# Patient Record
Sex: Male | Born: 1940
Health system: Southern US, Community
[De-identification: ages and names within clinical notes are randomized; demographics above are authoritative.]

## PROBLEM LIST (undated history)

## (undated) DIAGNOSIS — K449 Diaphragmatic hernia without obstruction or gangrene: Secondary | ICD-10-CM

## (undated) DIAGNOSIS — C61 Malignant neoplasm of prostate: Secondary | ICD-10-CM

## (undated) DIAGNOSIS — R7302 Impaired glucose tolerance (oral): Secondary | ICD-10-CM

## (undated) DIAGNOSIS — K2270 Barrett's esophagus without dysplasia: Secondary | ICD-10-CM

## (undated) DIAGNOSIS — I8390 Asymptomatic varicose veins of unspecified lower extremity: Secondary | ICD-10-CM

## (undated) DIAGNOSIS — R600 Localized edema: Secondary | ICD-10-CM

## (undated) DIAGNOSIS — Z8711 Personal history of peptic ulcer disease: Secondary | ICD-10-CM

## (undated) DIAGNOSIS — I10 Essential (primary) hypertension: Secondary | ICD-10-CM

## (undated) DIAGNOSIS — E119 Type 2 diabetes mellitus without complications: Secondary | ICD-10-CM

## (undated) DIAGNOSIS — K219 Gastro-esophageal reflux disease without esophagitis: Secondary | ICD-10-CM

## (undated) DIAGNOSIS — K5790 Diverticulosis of intestine, part unspecified, without perforation or abscess without bleeding: Secondary | ICD-10-CM

## (undated) DIAGNOSIS — H409 Unspecified glaucoma: Secondary | ICD-10-CM

## (undated) DIAGNOSIS — E669 Obesity, unspecified: Secondary | ICD-10-CM

## (undated) DIAGNOSIS — I2510 Atherosclerotic heart disease of native coronary artery without angina pectoris: Secondary | ICD-10-CM

## (undated) DIAGNOSIS — Z973 Presence of spectacles and contact lenses: Secondary | ICD-10-CM

## (undated) DIAGNOSIS — E785 Hyperlipidemia, unspecified: Secondary | ICD-10-CM

## (undated) DIAGNOSIS — S63259A Unspecified dislocation of unspecified finger, initial encounter: Secondary | ICD-10-CM

## (undated) HISTORY — PX: OTHER SURGICAL HISTORY: SHX169

## (undated) HISTORY — DX: Unspecified dislocation of unspecified finger, initial encounter: S63.259A

## (undated) HISTORY — DX: Essential (primary) hypertension: I10

## (undated) HISTORY — DX: Hyperlipidemia, unspecified: E78.5

## (undated) HISTORY — DX: Diverticulosis of intestine, part unspecified, without perforation or abscess without bleeding: K57.90

## (undated) HISTORY — PX: APPENDECTOMY: SHX54

## (undated) HISTORY — PX: TONSILLECTOMY: SUR1361

## (undated) HISTORY — DX: Obesity, unspecified: E66.9

## (undated) HISTORY — DX: Type 2 diabetes mellitus without complications: E11.9

## (undated) HISTORY — DX: Impaired glucose tolerance (oral): R73.02

---
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
## 1997-12-02 ENCOUNTER — Ambulatory Visit (HOSPITAL_COMMUNITY): Admission: RE | Admit: 1997-12-02 | Discharge: 1997-12-02 | Payer: Self-pay | Admitting: Gastroenterology

## 2000-01-29 HISTORY — PX: NASAL SINUS SURGERY: SHX719

## 2001-01-27 ENCOUNTER — Ambulatory Visit (HOSPITAL_COMMUNITY): Admission: RE | Admit: 2001-01-27 | Discharge: 2001-01-27 | Payer: Self-pay | Admitting: Gastroenterology

## 2010-01-28 HISTORY — PX: VARICOSE VEIN SURGERY: SHX832

## 2011-04-01 DIAGNOSIS — I8310 Varicose veins of unspecified lower extremity with inflammation: Secondary | ICD-10-CM | POA: Diagnosis not present

## 2011-05-03 DIAGNOSIS — H26499 Other secondary cataract, unspecified eye: Secondary | ICD-10-CM | POA: Diagnosis not present

## 2011-05-03 DIAGNOSIS — H409 Unspecified glaucoma: Secondary | ICD-10-CM | POA: Diagnosis not present

## 2011-05-03 DIAGNOSIS — H4011X0 Primary open-angle glaucoma, stage unspecified: Secondary | ICD-10-CM | POA: Diagnosis not present

## 2011-05-23 DIAGNOSIS — I8310 Varicose veins of unspecified lower extremity with inflammation: Secondary | ICD-10-CM | POA: Diagnosis not present

## 2011-06-28 DIAGNOSIS — I8310 Varicose veins of unspecified lower extremity with inflammation: Secondary | ICD-10-CM | POA: Diagnosis not present

## 2011-07-01 DIAGNOSIS — I8310 Varicose veins of unspecified lower extremity with inflammation: Secondary | ICD-10-CM | POA: Diagnosis not present

## 2011-08-19 DIAGNOSIS — Z13220 Encounter for screening for lipoid disorders: Secondary | ICD-10-CM | POA: Diagnosis not present

## 2011-08-19 DIAGNOSIS — Z125 Encounter for screening for malignant neoplasm of prostate: Secondary | ICD-10-CM | POA: Diagnosis not present

## 2011-08-19 DIAGNOSIS — Z0000 Encounter for general adult medical examination without abnormal findings: Secondary | ICD-10-CM | POA: Diagnosis not present

## 2011-08-19 DIAGNOSIS — Z1331 Encounter for screening for depression: Secondary | ICD-10-CM | POA: Diagnosis not present

## 2011-08-19 DIAGNOSIS — K219 Gastro-esophageal reflux disease without esophagitis: Secondary | ICD-10-CM | POA: Diagnosis not present

## 2011-08-19 DIAGNOSIS — Z136 Encounter for screening for cardiovascular disorders: Secondary | ICD-10-CM | POA: Diagnosis not present

## 2011-08-23 DIAGNOSIS — I8310 Varicose veins of unspecified lower extremity with inflammation: Secondary | ICD-10-CM | POA: Diagnosis not present

## 2011-08-23 DIAGNOSIS — M79609 Pain in unspecified limb: Secondary | ICD-10-CM | POA: Diagnosis not present

## 2011-09-27 DIAGNOSIS — M79609 Pain in unspecified limb: Secondary | ICD-10-CM | POA: Diagnosis not present

## 2011-09-27 DIAGNOSIS — I8310 Varicose veins of unspecified lower extremity with inflammation: Secondary | ICD-10-CM | POA: Diagnosis not present

## 2012-01-29 HISTORY — PX: CATARACT EXTRACTION W/ INTRAOCULAR LENS IMPLANT: SHX1309

## 2012-01-31 DIAGNOSIS — H26499 Other secondary cataract, unspecified eye: Secondary | ICD-10-CM | POA: Diagnosis not present

## 2012-01-31 DIAGNOSIS — H4011X0 Primary open-angle glaucoma, stage unspecified: Secondary | ICD-10-CM | POA: Diagnosis not present

## 2012-01-31 DIAGNOSIS — H409 Unspecified glaucoma: Secondary | ICD-10-CM | POA: Diagnosis not present

## 2012-03-09 DIAGNOSIS — H409 Unspecified glaucoma: Secondary | ICD-10-CM | POA: Diagnosis not present

## 2012-03-09 DIAGNOSIS — H4011X0 Primary open-angle glaucoma, stage unspecified: Secondary | ICD-10-CM | POA: Diagnosis not present

## 2012-03-09 DIAGNOSIS — H26499 Other secondary cataract, unspecified eye: Secondary | ICD-10-CM | POA: Diagnosis not present

## 2012-03-09 DIAGNOSIS — H43819 Vitreous degeneration, unspecified eye: Secondary | ICD-10-CM | POA: Diagnosis not present

## 2012-06-08 DIAGNOSIS — H01009 Unspecified blepharitis unspecified eye, unspecified eyelid: Secondary | ICD-10-CM | POA: Diagnosis not present

## 2012-06-08 DIAGNOSIS — H43819 Vitreous degeneration, unspecified eye: Secondary | ICD-10-CM | POA: Diagnosis not present

## 2012-06-08 DIAGNOSIS — H4011X0 Primary open-angle glaucoma, stage unspecified: Secondary | ICD-10-CM | POA: Diagnosis not present

## 2012-09-29 DIAGNOSIS — H409 Unspecified glaucoma: Secondary | ICD-10-CM | POA: Diagnosis not present

## 2012-09-29 DIAGNOSIS — H4011X0 Primary open-angle glaucoma, stage unspecified: Secondary | ICD-10-CM | POA: Diagnosis not present

## 2012-09-29 DIAGNOSIS — H01009 Unspecified blepharitis unspecified eye, unspecified eyelid: Secondary | ICD-10-CM | POA: Diagnosis not present

## 2012-09-29 DIAGNOSIS — H43819 Vitreous degeneration, unspecified eye: Secondary | ICD-10-CM | POA: Diagnosis not present

## 2012-10-08 ENCOUNTER — Other Ambulatory Visit: Payer: Self-pay | Admitting: Gastroenterology

## 2012-10-14 ENCOUNTER — Encounter (HOSPITAL_COMMUNITY): Payer: Self-pay | Admitting: *Deleted

## 2012-10-15 ENCOUNTER — Encounter (HOSPITAL_COMMUNITY): Payer: Self-pay | Admitting: Pharmacy Technician

## 2012-10-27 ENCOUNTER — Encounter (HOSPITAL_COMMUNITY): Payer: Self-pay | Admitting: Anesthesiology

## 2012-10-27 ENCOUNTER — Ambulatory Visit (HOSPITAL_COMMUNITY): Payer: Medicare Other | Admitting: Anesthesiology

## 2012-10-27 ENCOUNTER — Encounter (HOSPITAL_COMMUNITY)
Admission: RE | Disposition: A | Discharge status: Home / Self Care | Payer: Self-pay | Source: Ambulatory Visit | Attending: Gastroenterology

## 2012-10-27 ENCOUNTER — Ambulatory Visit (HOSPITAL_COMMUNITY)
Admission: RE | Admit: 2012-10-27 | Discharge: 2012-10-27 | Disposition: A | Discharge status: Home / Self Care | Payer: Medicare Other | Source: Ambulatory Visit | Attending: Gastroenterology | Admitting: Gastroenterology

## 2012-10-27 DIAGNOSIS — K219 Gastro-esophageal reflux disease without esophagitis: Secondary | ICD-10-CM | POA: Insufficient documentation

## 2012-10-27 DIAGNOSIS — Z1211 Encounter for screening for malignant neoplasm of colon: Secondary | ICD-10-CM | POA: Diagnosis not present

## 2012-10-27 DIAGNOSIS — K319 Disease of stomach and duodenum, unspecified: Secondary | ICD-10-CM | POA: Diagnosis not present

## 2012-10-27 DIAGNOSIS — K5730 Diverticulosis of large intestine without perforation or abscess without bleeding: Secondary | ICD-10-CM | POA: Diagnosis not present

## 2012-10-27 DIAGNOSIS — K2270 Barrett's esophagus without dysplasia: Secondary | ICD-10-CM | POA: Diagnosis not present

## 2012-10-27 HISTORY — DX: Barrett's esophagus without dysplasia: K22.70

## 2012-10-27 HISTORY — PX: COLONOSCOPY WITH PROPOFOL: SHX5780

## 2012-10-27 HISTORY — DX: Gastro-esophageal reflux disease without esophagitis: K21.9

## 2012-10-27 HISTORY — PX: ESOPHAGOGASTRODUODENOSCOPY (EGD) WITH PROPOFOL: SHX5813

## 2012-10-27 HISTORY — DX: Unspecified glaucoma: H40.9

## 2012-10-27 SURGERY — ESOPHAGOGASTRODUODENOSCOPY (EGD) WITH PROPOFOL
Anesthesia: Monitor Anesthesia Care

## 2012-10-27 MED ORDER — SODIUM CHLORIDE 0.9 % IV SOLN: INTRAVENOUS | Status: DC

## 2012-10-27 MED ORDER — LACTATED RINGERS IV SOLN
INTRAVENOUS | Status: DC
  Administered 2012-10-27: 1000 mL via INTRAVENOUS

## 2012-10-27 MED ORDER — MIDAZOLAM HCL 5 MG/5ML IJ SOLN
INTRAMUSCULAR | Status: DC | PRN
  Administered 2012-10-27: 2 mg via INTRAVENOUS

## 2012-10-27 MED ORDER — BUTAMBEN-TETRACAINE-BENZOCAINE 2-2-14 % EX AERO
INHALATION_SPRAY | CUTANEOUS | Status: DC | PRN
  Administered 2012-10-27: 2 via TOPICAL

## 2012-10-27 MED ORDER — KETAMINE HCL 50 MG/ML IJ SOLN
INTRAMUSCULAR | Status: DC | PRN
  Administered 2012-10-27: 25 mg via INTRAMUSCULAR

## 2012-10-27 MED ORDER — PROPOFOL INFUSION 10 MG/ML OPTIME
INTRAVENOUS | Status: DC | PRN
  Administered 2012-10-27: 70 ug/kg/min via INTRAVENOUS

## 2012-10-27 SURGICAL SUPPLY — 24 items

## 2012-10-27 NOTE — H&P (Signed)
  Problem: Barrett's esophagus. Screening colonoscopy  History: The patient is a 72 year old male born [birth year]. The patient has chronic gastroesophageal reflux complicated by Barrett's esophagus. He underwent a normal screening colonoscopy in November 2004.  The patient is scheduled to undergo a repeat screening colonoscopy and a diagnostic esophagogastroduodenoscopy performance of surveillance Barrett's mucosal biopsies to rule out dysplasia.  Past medical history: Gastroesophageal reflux complicated by Barrett's esophagus. Vasomotor rhinitis. Herniated disc. Fractured skull in high school. Colonic diverticulosis. Appendectomy. Tonsillectomy. Bilateral cataract surgery. Nasal surgery. Varicose vein surgery.  Allergies: None  Exam: The patient is alert and lying comfortably on the endoscopy stretcher. Lungs are clear to auscultation. Cardiac exam reveals a regular rhythm. Abdomen is soft and nontender to palpation.  Plan: Proceed with repeat screening colonoscopy and diagnostic esophagogastroduodenoscopy with surveillance Barrett's mucosal biopsies.

## 2012-10-27 NOTE — Anesthesia Postprocedure Evaluation (Signed)
  Anesthesia Post-op Note  Patient: Allen Cervantes  Procedure(s) Performed: Procedure(s) (LRB): ESOPHAGOGASTRODUODENOSCOPY (EGD) WITH PROPOFOL (N/A) COLONOSCOPY WITH PROPOFOL (N/A)  Patient Location: PACU  Anesthesia Type: MAC  Level of Consciousness: awake and alert   Airway and Oxygen Therapy: Patient Spontanous Breathing  Post-op Pain: mild  Post-op Assessment: Post-op Vital signs reviewed, Patient's Cardiovascular Status Stable, Respiratory Function Stable, Patent Airway and No signs of Nausea or vomiting  Last Vitals:  Filed Vitals:   10/27/12 0950  BP: 128/83  Temp:   Resp: 16    Post-op Vital Signs: stable   Complications: No apparent anesthesia complications

## 2012-10-27 NOTE — Anesthesia Preprocedure Evaluation (Signed)
Anesthesia Evaluation  Patient identified by MRN, date of birth, ID band Patient awake    Reviewed: Allergy & Precautions, H&P , NPO status , Patient's Chart, lab work & pertinent test results  Airway Mallampati: II TM Distance: >3 FB Neck ROM: Full    Dental no notable dental hx.    Pulmonary former smoker,  breath sounds clear to auscultation  Pulmonary exam normal       Cardiovascular negative cardio ROS  Rhythm:Regular Rate:Normal     Neuro/Psych negative neurological ROS  negative psych ROS   GI/Hepatic Neg liver ROS, GERD-  Medicated,  Endo/Other  negative endocrine ROS  Renal/GU negative Renal ROS  negative genitourinary   Musculoskeletal negative musculoskeletal ROS (+)   Abdominal   Peds negative pediatric ROS (+)  Hematology negative hematology ROS (+)   Anesthesia Other Findings   Reproductive/Obstetrics negative OB ROS                           Anesthesia Physical Anesthesia Plan  ASA: II  Anesthesia Plan: MAC   Post-op Pain Management:    Induction:   Airway Management Planned:   Additional Equipment:   Intra-op Plan:   Post-operative Plan:   Informed Consent: I have reviewed the patients History and Physical, chart, labs and discussed the procedure including the risks, benefits and alternatives for the proposed anesthesia with the patient or authorized representative who has indicated his/her understanding and acceptance.   Dental advisory given  Plan Discussed with: CRNA  Anesthesia Plan Comments:         Anesthesia Quick Evaluation

## 2012-10-27 NOTE — Transfer of Care (Signed)
Immediate Anesthesia Transfer of Care Note  Patient: Allen Cervantes  Procedure(s) Performed: Procedure(s): ESOPHAGOGASTRODUODENOSCOPY (EGD) WITH PROPOFOL (N/A) COLONOSCOPY WITH PROPOFOL (N/A)  Patient Location: PACU  Anesthesia Type:MAC  Level of Consciousness: sedated  Airway & Oxygen Therapy: Patient Spontanous Breathing and Patient connected to nasal cannula oxygen  Post-op Assessment: Report given to PACU RN and Post -op Vital signs reviewed and stable  Post vital signs: Reviewed and stable  Complications: No apparent anesthesia complications

## 2012-10-27 NOTE — Op Note (Signed)
Problem: Barrett's esophagus  Endoscopist: Danise Edge  Premedication: Propofol administered by anesthesia  Procedure: Diagnostic esophagogastroduodenoscopy with Barrett's mucosal biopsies The patient was placed in the left lateral decubitus position. The Pentax gastroscope was passed through the posterior hypopharynx into the proximal esophagus without difficulty. The hypopharynx, larynx, and vocal cords appeared normal.  Esophagoscopy: The proximal esophageal mucosa appeared normal. The squamocolumnar junction was irregular and noted at 30 cm from the incisor teeth. Barrett's-appearing mucosa extends from 30 cm to 35 cm from the incisor teeth. Four-quadrant Barrett's mucosal biopsies were performed at 30 cm, 33 cm, and 35 cm.  Gastroscopy: Retroflex view of the gastric cardia and fundus was normal. The gastric body, antrum, and pylorus appeared normal.  Duodenoscopy: The duodenal bulb and descending duodenum appeared normal.  Assessment: Barrett's mucosa extends from 30 cm to 35 cm from the incisor teeth. Barrett's mucosal biopsies pending. Otherwise normal esophagogastroduodenoscopy.  Procedure: Screening colonoscopy. Anal inspection and digital rectal exam were normal. The Pentax pediatric colonoscope was introduced into the rectum and advanced to the cecum. A normal-appearing ileocecal valve and appendiceal orifice were identified. Colonic preparation for the exam today was good.  Rectum. Normal. Retroflexed view of the distal rectum normal.  Sigmoid colon and descending colon. Normal.  Splenic flexure. Normal.  Transverse colon. Normal.  Hepatic flexure. Normal.  Ascending colon. Normal.  Cecum and ileocecal valve. Normal.  Assessment: Normal screening proctocolonoscopy to the cecum.  Recommendation: Schedule repeat screening colonoscopy in 10 years.

## 2012-10-28 ENCOUNTER — Encounter (HOSPITAL_COMMUNITY): Payer: Self-pay | Admitting: Gastroenterology

## 2012-12-04 DIAGNOSIS — E786 Lipoprotein deficiency: Secondary | ICD-10-CM | POA: Diagnosis not present

## 2012-12-04 DIAGNOSIS — Z23 Encounter for immunization: Secondary | ICD-10-CM | POA: Diagnosis not present

## 2012-12-04 DIAGNOSIS — Z1331 Encounter for screening for depression: Secondary | ICD-10-CM | POA: Diagnosis not present

## 2012-12-04 DIAGNOSIS — Z0000 Encounter for general adult medical examination without abnormal findings: Secondary | ICD-10-CM | POA: Diagnosis not present

## 2012-12-04 DIAGNOSIS — K219 Gastro-esophageal reflux disease without esophagitis: Secondary | ICD-10-CM | POA: Diagnosis not present

## 2012-12-04 DIAGNOSIS — Z125 Encounter for screening for malignant neoplasm of prostate: Secondary | ICD-10-CM | POA: Diagnosis not present

## 2013-05-31 DIAGNOSIS — D235 Other benign neoplasm of skin of trunk: Secondary | ICD-10-CM | POA: Diagnosis not present

## 2013-05-31 DIAGNOSIS — D485 Neoplasm of uncertain behavior of skin: Secondary | ICD-10-CM | POA: Diagnosis not present

## 2013-05-31 DIAGNOSIS — L821 Other seborrheic keratosis: Secondary | ICD-10-CM | POA: Diagnosis not present

## 2013-05-31 DIAGNOSIS — L819 Disorder of pigmentation, unspecified: Secondary | ICD-10-CM | POA: Diagnosis not present

## 2013-07-10 ENCOUNTER — Encounter: Payer: Self-pay | Admitting: Family Medicine

## 2013-07-10 ENCOUNTER — Ambulatory Visit (INDEPENDENT_AMBULATORY_CARE_PROVIDER_SITE_OTHER): Payer: Medicare Other | Admitting: Family Medicine

## 2013-07-10 ENCOUNTER — Ambulatory Visit (INDEPENDENT_AMBULATORY_CARE_PROVIDER_SITE_OTHER): Payer: Medicare Other

## 2013-07-10 VITALS — BP 120/70 | HR 64 | Temp 98.6°F | Resp 16 | Ht 72.5 in | Wt 254.2 lb

## 2013-07-10 DIAGNOSIS — S20219A Contusion of unspecified front wall of thorax, initial encounter: Secondary | ICD-10-CM

## 2013-07-10 NOTE — Progress Notes (Signed)
[facility] [address], [place] [address] [phone]  Date:  07/10/2013   Name:  Allen Cervantes   DOB:  07-20-1940   MRN:  [id number]  PCP:  Irven Shelling, MD    Chief Complaint: Rib Injury and Fall   History of Present Illness:  Allen Cervantes is a 73 y.o. very pleasant male patient who presents with the following:  He is here today with an injury.  Yesterday he fell into his pool- tripped and hit his elbow and his right ribs on the edge of the pool.  He did not hit his head.  His main concern is the pain in his right side.  He wonders is he might have a broken rib or contused rib He is eating ok, no vomiting.  He has not noted any abdominal pain.   He is generally healthy.  He thinks that his last tetanus was about 3 years ago- he knows it is UTD.    There are no active problems to display for this patient.   Past Medical History  Diagnosis Date  . GERD (gastroesophageal reflux disease)   . Glaucoma     both eyes  . Barrett's esophagus     Past Surgical History  Procedure Laterality Date  . Omentum lining of stomach  age 73  . Appendectomy  age 81  . Esophagogastroduodenoscopy (egd) with propofol N/A 10/27/2012    Procedure: ESOPHAGOGASTRODUODENOSCOPY (EGD) WITH PROPOFOL;  Surgeon: Garlan Fair, MD;  Location: [facility];  Service: Endoscopy;  Laterality: N/A;  . Colonoscopy with propofol N/A 10/27/2012    Procedure: COLONOSCOPY WITH PROPOFOL;  Surgeon: Garlan Fair, MD;  Location: [facility];  Service: Endoscopy;  Laterality: N/A;    History  Substance Use Topics  . Smoking status: Former Smoker -- 1.00 packs/day for 10 years    Types: Cigarettes  . Smokeless tobacco: Never Used     Comment: quit smoking 40 yrs agio  . Alcohol Use: Yes     Comment: wine glass 1-2 per day    No family history on file.  No Known Allergies  Medication list has been reviewed and updated.  Current Outpatient Prescriptions on File Prior  to Visit  Medication Sig Dispense Refill  . brimonidine-timolol (COMBIGAN) 0.2-0.5 % ophthalmic solution 1 drop every 12 (twelve) hours.      . lansoprazole (PREVACID) 30 MG capsule Take 30 mg by mouth daily.       No current facility-administered medications on file prior to visit.    Review of Systems:  As per HPI- otherwise negative.  Physical Examination: Filed Vitals:   07/10/13 1237  BP: 120/70  Pulse: 64  Temp: 98.6 F (37 C)  Resp: 16   Filed Vitals:   07/10/13 1237  Height: 6' 0.5" (1.842 m)  Weight: 254 lb 3.2 oz (115.304 kg)   Body mass index is 33.98 kg/(m^2). Ideal Body Weight: Weight in (lb) to have BMI = 25: 186.5  GEN: WDWN, NAD, Non-toxic, A & O x 3, overweight, looks well HEENT: Atraumatic, Normocephalic. Neck supple. No masses, No LAD.  No cervical spine tenderness and normal ROM Ears and Nose: No external deformity. CV: RRR, No M/G/R. No JVD. No thrill. No extra heart sounds. PULM: CTA B, no wheezes, crackles, rhonchi. No retractions. No resp. distress. No accessory muscle use. ABD: S, NT, ND, +BS. No rebound. No HSM. EXTR: No c/c/e NEURO Normal gait.  PSYCH: Normally interactive. Conversant. Not  depressed or anxious appearing.  Calm demeanor.  Right elbow: there is an abrasion over the posterior elbow.  He has full ROM with extension and flexion of the elbow, and with pronation/ supination.  No pain. He does not feel that an elbow film is necessary and I agree Right ribs: he is tender over the lower right lateral ribs.   There is no bruise or swelling over the ribs.  The pain does seem to be bony and not in his abdomen.    [facility] reading (PRIMARY) by  Dr. Lorelei Pont. Right ribs: negative CXR: negative  EXAM: RIGHT RIBS - 2 VIEW  COMPARISON: None.  FINDINGS: No fracture or other bone lesions are seen involving the ribs. No fracture or other bone lesions are seen involving the ribs.  IMPRESSION: Negative.  CHEST - 1 VIEW  COMPARISON:  None.  FINDINGS: Mediastinum and hilar structures normal. Subsegmental atelectasis lung bases noted. No pleural effusion or pneumothorax. Heart size normal. Pulmonary vascularity normal. Sliding hiatal hernia. Tiny nodular density noted projected over the left lung base seen only on PA view is most likely a nipple shadow. Repeat chest x-ray with nipple markers can be obtained. Severe osteopenia with degenerative changes thoracic spine. Multiple compression fractures, age undetermined.  IMPRESSION: 1. Subsegmental atelectasis lung bases. 2. Tiny nodular density left lung base, most likely nipple shadow. Repeat chest x-ray with nipple markers can be obtained. 3. Sliding hiatal hernia. 4. Diffuse thoracic spine osteopenia. Multiple compression fractures, age undetermined noted.  Assessment and Plan: Contusion of ribs - Plan: DG Ribs Unilateral Right, DG Chest 1 View  Contusion of ribs. No fracture is apparent. Called him and went over the multiple findings from his chest film. He was aware of the hiatal hernia. Discussed possible low bone mass and compression fractures.  He does not have any pain in his thoracic spine, although he has had some trouble with this in the past.  Also, he has a likely nipple shadow that should be followed up. He plans to pursue these issues with his PCP.  I will mail him a copy of his reports   Signed Lamar Blinks, MD

## 2013-07-10 NOTE — Patient Instructions (Signed)
Take care and take it easy.  I will be in touch if the radiologist sees anything of interest on your x-rays.  If you develop any severe abdominal pain, feel faint, or have nausea or vomiting please seek care right away!  Call with concerns at any time.

## 2013-07-13 ENCOUNTER — Ambulatory Visit (INDEPENDENT_AMBULATORY_CARE_PROVIDER_SITE_OTHER): Payer: Medicare Other

## 2013-07-13 ENCOUNTER — Ambulatory Visit (INDEPENDENT_AMBULATORY_CARE_PROVIDER_SITE_OTHER): Payer: Medicare Other | Admitting: Internal Medicine

## 2013-07-13 VITALS — BP 138/78 | HR 53 | Temp 98.0°F | Resp 16 | Ht 72.0 in | Wt 253.4 lb

## 2013-07-13 DIAGNOSIS — R079 Chest pain, unspecified: Secondary | ICD-10-CM | POA: Diagnosis not present

## 2013-07-13 DIAGNOSIS — S20219A Contusion of unspecified front wall of thorax, initial encounter: Secondary | ICD-10-CM | POA: Diagnosis not present

## 2013-07-13 DIAGNOSIS — R9389 Abnormal findings on diagnostic imaging of other specified body structures: Secondary | ICD-10-CM

## 2013-07-13 DIAGNOSIS — R918 Other nonspecific abnormal finding of lung field: Secondary | ICD-10-CM | POA: Diagnosis not present

## 2013-07-13 NOTE — Patient Instructions (Signed)
Immunization Schedule, Adult  Influenza vaccine.  All adults should be immunized every year.  All adults, including pregnant women and people with hives-only allergy to eggs can receive the inactivated influenza (IIV) vaccine.  Adults aged 71 49 years can receive the recombinant influenza (RIV) vaccine. The RIV vaccine does not contain any egg protein.  Adults aged 65 years or older can receive the standard-dose IIV or the high-dose IIV.  Tetanus, diphtheria, and acellular pertussis (Td, Tdap) vaccine.  Pregnant women should receive 1 dose of Tdap vaccine during each pregnancy. The dose should be obtained regardless of the length of time since the last dose. Immunization is preferred during the 27th to 36th week of gestation.  An adult who has not previously received Tdap or who does not know his or her vaccine status should receive 1 dose of Tdap. This initial dose should be followed by tetanus and diphtheria toxoids (Td) booster doses every 10 years.  Adults with an unknown or incomplete history of completing a 3-dose immunization series with Td-containing vaccines should begin or complete a primary immunization series including a Tdap dose.  Adults should receive a Td booster every 10 years.  Varicella vaccine.  An adult without evidence of immunity to varicella should receive 2 doses or a second dose if he or she has previously received 1 dose.  Pregnant females who do not have evidence of immunity should receive the first dose after pregnancy. This first dose should be obtained before leaving the health care facility. The second dose should be obtained 4 8 weeks after the first dose.  Human papillomavirus (HPV) vaccine.  Females aged 13 26 years who have not received the vaccine previously should obtain the 3-dose series.  The vaccine is not recommended for use in pregnant females. However, pregnancy testing is not needed before receiving a dose. If a male is found to be  pregnant after receiving a dose, no treatment is needed. In that case, the remaining doses should be delayed until after the pregnancy.  Males aged 13 21 years who have not received the vaccine previously should receive the 3-dose series. Males aged 22 26 years may be immunized.  Immunization is recommended through the age of 26 years for any male who has sex with males and did not get any or all doses earlier.  Immunization is recommended for any person with an immunocompromised condition through the age of 26 years if he or she did not get any or all doses earlier.  During the 3-dose series, the second dose should be obtained 4 8 weeks after the first dose. The third dose should be obtained 24 weeks after the first dose and 16 weeks after the second dose.  Zoster vaccine.  One dose is recommended for adults aged 60 years or older unless certain conditions are present.  Measles, mumps, and rubella (MMR) vaccine.  Adults born before 1957 generally are considered immune to measles and mumps.  Adults born in 1957 or later should have 1 or more doses of MMR vaccine unless there is a contraindication to the vaccine or there is laboratory evidence of immunity to each of the three diseases.  A routine second dose of MMR vaccine should be obtained at least 28 days after the first dose for students attending postsecondary schools, health care workers, or international travelers.  People who received inactivated measles vaccine or an unknown type of measles vaccine during 1963 1967 should receive 2 doses of MMR vaccine.  People who received   inactivated mumps vaccine or an unknown type of mumps vaccine before 1979 and are at high risk for mumps infection should consider immunization with 2 doses of MMR vaccine.  For females of childbearing age, rubella immunity should be determined. If there is no evidence of immunity, females who are not pregnant should be vaccinated. If there is no evidence of  immunity, females who are pregnant should delay immunization until after pregnancy.  Unvaccinated health care workers born before 45 who lack laboratory evidence of measles, mumps, or rubella immunity or laboratory confirmation of disease should consider measles and mumps immunization with 2 doses of MMR vaccine or rubella immunization with 1 dose of MMR vaccine.  Pneumococcal 13-valent conjugate (PCV13) vaccine.  When indicated, a person who is uncertain of his or her immunization history and has no record of immunization should receive the PCV13 vaccine.  An adult aged 38 years or older who has certain medical conditions and has not been previously immunized should receive 1 dose of PCV13 vaccine. This PCV13 should be followed with a dose of pneumococcal polysaccharide (PPSV23) vaccine. The PPSV23 vaccine dose should be obtained at least 8 weeks after the dose of PCV13 vaccine.  An adult aged 33 years or older who has certain medical conditions and previously received 1 or more doses of PPSV23 vaccine should receive 1 dose of PCV13. The PCV13 vaccine dose should be obtained 1 or more years after the last PPSV23 vaccine dose.  Pneumococcal polysaccharide (PPSV23) vaccine.  When PCV13 is also indicated, PCV13 should be obtained first.  All adults aged 71 years and older should be immunized.  An adult younger than age 74 years who has certain medical conditions should be immunized.  Any person who resides in a nursing home or long-term care facility should be immunized.  An adult smoker should be immunized.  People with an immunocompromised condition and certain other conditions should receive both PCV13 and PPSV23 vaccines.  People with human immunodeficiency virus (HIV) infection should be immunized as soon as possible after diagnosis.  Immunization during chemotherapy or radiation therapy should be avoided.  Routine use of PPSV23 vaccine is not recommended for American Indians,  [place] Natives, or people younger than 65 years unless there are medical conditions that require PPSV23 vaccine.  When indicated, people who have unknown immunization and have no record of immunization should receive PPSV23 vaccine.  One-time revaccination 5 years after the first dose of PPSV23 is recommended for people aged 22 64 years who have chronic kidney failure, nephrotic syndrome, asplenia, or immunocompromised conditions.  People who received 1 2 doses of PPSV23 before age 69 years should receive another dose of PPSV23 vaccine at age 70 years or later if at least 5 years have passed since the previous dose.  Doses of PPSV23 are not needed for people immunized with PPSV23 at or after age 66 years.  Meningococcal vaccine.  Adults with asplenia or persistent complement component deficiencies should receive 2 doses of quadrivalent meningococcal conjugate (MenACWY-D) vaccine. The doses should be obtained at least 2 months apart.  Microbiologists working with certain meningococcal bacteria, [place] recruits, people at risk during an outbreak, and people who travel to or live in countries with a high rate of meningitis should be immunized.  A first-year college student up through age 74 years who is living in a residence hall should receive a dose if he or she did not receive a dose on or after his or her 16th birthday.  Adults who have  certain high-risk conditions should receive one or more doses of vaccine.  Hepatitis A vaccine.  Adults who wish to be protected from this disease, have certain high-risk conditions, work with hepatitis A-infected animals, work in hepatitis A research labs, or travel to or work in countries with a high rate of hepatitis A should be immunized.  Adults who were previously unvaccinated and who anticipate close contact with an international adoptee during the first 60 days after arrival in the [place] from a country with a high rate of hepatitis A should  be immunized.  Hepatitis B vaccine.  Adults who wish to be protected from this disease, have certain high-risk conditions, may be exposed to blood or other infectious body fluids, are household contacts or sex partners of hepatitis B positive people, are clients or workers in certain care facilities, or travel to or work in countries with a high rate of hepatitis B should be immunized.  Haemophilus influenzae type b (Hib) vaccine.  A previously unvaccinated person with asplenia or sickle cell disease or having a scheduled splenectomy should receive 1 dose of Hib vaccine.  Regardless of previous immunization, a recipient of a hematopoietic stem cell transplant should receive a 3-dose series 6 12 months after his or her successful transplant.  Hib vaccine is not recommended for adults with HIV infection. Document Released: 04/06/2003 Document Revised: 05/11/2012 Document Reviewed: 03/03/2012 [facility] Patient Information 2014 [place], [place].

## 2013-07-13 NOTE — Progress Notes (Signed)
   Subjective:    Patient ID: Allen Cervantes, male    DOB: July 10, 1940, 73 y.o.   MRN: [id number]  HPI Had abnormal cxr last week. Repeat rec with nipple markers ro nodule. Has contused ribs but other wise healthy.   Review of Systems     Objective:   Physical Exam  Constitutional: He is oriented to person, place, and time. He appears well-developed and well-nourished. No distress.  HENT:  Head: Normocephalic.  Eyes: EOM are normal.  Neck: Normal range of motion.  Pulmonary/Chest: Effort normal. He exhibits tenderness.  Musculoskeletal: Normal range of motion.  Neurological: He is alert and oriented to person, place, and time. He exhibits normal muscle tone. Coordination normal.  Psychiatric: He has a normal mood and affect. His behavior is normal. Judgment and thought content normal.     [facility] Policy for Prescribing Controlled Substances (Revised 11/2011) 1. Prescriptions for controlled substances will be filled by ONE provider at [facility] with whom you have established and developed a plan for your care, including follow-up. 2. You are encouraged to schedule an appointment with your prescriber at our appointment center for follow-up visits whenever possible. 3. If you request a prescription for the controlled substance while at [facility] for an acute problem (with someone other than your regular prescriber), you MAY be given a ONE-TIME prescription for a 30-day supply of the controlled substance, to allow time for you to return to see your regular prescriber for additional prescriptions. [facility] reading (PRIMARY) by  Dr.Guest nipple shadow appears to be the density, normal cxr       Assessment & Plan:  Abnormal cxr/Contused ribs/Normal repeat cxr

## 2013-11-02 ENCOUNTER — Ambulatory Visit (INDEPENDENT_AMBULATORY_CARE_PROVIDER_SITE_OTHER): Payer: Medicare Other

## 2013-11-02 ENCOUNTER — Ambulatory Visit (INDEPENDENT_AMBULATORY_CARE_PROVIDER_SITE_OTHER): Payer: Medicare Other | Admitting: Internal Medicine

## 2013-11-02 VITALS — BP 122/84 | HR 64 | Temp 98.5°F | Resp 16 | Ht 73.25 in | Wt 259.8 lb

## 2013-11-02 DIAGNOSIS — M25421 Effusion, right elbow: Secondary | ICD-10-CM | POA: Diagnosis not present

## 2013-11-02 DIAGNOSIS — M7021 Olecranon bursitis, right elbow: Secondary | ICD-10-CM | POA: Diagnosis not present

## 2013-11-02 MED ORDER — IBUPROFEN 600 MG PO TABS: 600.0000 mg | ORAL_TABLET | Freq: Three times a day (TID) | ORAL | Status: DC | PRN

## 2013-11-02 NOTE — Progress Notes (Signed)
   Subjective:    Patient ID: Allen Cervantes, male    DOB: Jun 08, 1940, 73 y.o.   MRN: [id number]  HPI    Review of Systems     Objective:   Physical Exam        Assessment & Plan:

## 2013-11-02 NOTE — Patient Instructions (Signed)
Olecranon Bursitis Bursitis is swelling and soreness (inflammation) of a fluid-filled sac (bursa) that covers and protects a joint. Olecranon bursitis occurs over the elbow.  CAUSES Bursitis can be caused by injury, overuse of the joint, arthritis, or infection.  SYMPTOMS   Tenderness, swelling, warmth, or redness over the elbow.  Elbow pain with movement. This is greater with bending the elbow.  Squeaking sound when the bursa is rubbed or moved.  Increasing size of the bursa without pain or discomfort.  Fever with increasing pain and swelling if the bursa becomes infected. HOME CARE INSTRUCTIONS   Put ice on the affected area.  Put ice in a plastic bag.  Place a towel between your skin and the bag.  Leave the ice on for 15-20 minutes each hour while awake. Do this for the first 2 days.  When resting, elevate your elbow above the level of your heart. This helps reduce swelling.  Continue to put the joint through a full range of motion 4 times per day. Rest the injured joint at other times. When the pain lessens, begin normal slow movements and usual activities.  Only take over-the-counter or prescription medicines for pain, discomfort, or fever as directed by your caregiver.  Reduce your intake of milk and related dairy products (cheese, yogurt). They may make your condition worse. SEEK IMMEDIATE MEDICAL CARE IF:   Your pain increases even during treatment.  You have a fever.  You have heat and inflammation over the bursa and elbow.  You have a red line that goes up your arm.  You have pain with movement of your elbow. MAKE SURE YOU:   Understand these instructions.  Will watch your condition.  Will get help right away if you are not doing well or get worse. Document Released: 02/13/2006 Document Revised: 04/08/2011 Document Reviewed: 12/30/2006 ExitCare Patient Information 2015 [employer]. This information is not intended to replace advice given to you by your  health care provider. Make sure you discuss any questions you have with your health care provider.  
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
## 2013-11-03 DIAGNOSIS — H4011X3 Primary open-angle glaucoma, severe stage: Secondary | ICD-10-CM | POA: Diagnosis not present

## 2013-11-04 ENCOUNTER — Emergency Department (HOSPITAL_COMMUNITY): Payer: Medicare Other

## 2013-11-04 ENCOUNTER — Emergency Department (HOSPITAL_COMMUNITY)
Admission: EM | Admit: 2013-11-04 | Discharge: 2013-11-04 | Disposition: A | Discharge status: Home / Self Care | Payer: Medicare Other | Attending: Emergency Medicine | Admitting: Emergency Medicine

## 2013-11-04 ENCOUNTER — Encounter (HOSPITAL_COMMUNITY): Payer: Self-pay | Admitting: Emergency Medicine

## 2013-11-04 DIAGNOSIS — H409 Unspecified glaucoma: Secondary | ICD-10-CM | POA: Insufficient documentation

## 2013-11-04 DIAGNOSIS — S0990XA Unspecified injury of head, initial encounter: Secondary | ICD-10-CM | POA: Diagnosis not present

## 2013-11-04 DIAGNOSIS — S0512XA Contusion of eyeball and orbital tissues, left eye, initial encounter: Secondary | ICD-10-CM | POA: Insufficient documentation

## 2013-11-04 DIAGNOSIS — Z79899 Other long term (current) drug therapy: Secondary | ICD-10-CM | POA: Diagnosis not present

## 2013-11-04 DIAGNOSIS — K219 Gastro-esophageal reflux disease without esophagitis: Secondary | ICD-10-CM | POA: Insufficient documentation

## 2013-11-04 DIAGNOSIS — R5383 Other fatigue: Secondary | ICD-10-CM | POA: Diagnosis not present

## 2013-11-04 DIAGNOSIS — Z791 Long term (current) use of non-steroidal anti-inflammatories (NSAID): Secondary | ICD-10-CM | POA: Insufficient documentation

## 2013-11-04 DIAGNOSIS — Z048 Encounter for examination and observation for other specified reasons: Secondary | ICD-10-CM | POA: Diagnosis not present

## 2013-11-04 DIAGNOSIS — Z87891 Personal history of nicotine dependence: Secondary | ICD-10-CM | POA: Insufficient documentation

## 2013-11-04 DIAGNOSIS — S0993XA Unspecified injury of face, initial encounter: Secondary | ICD-10-CM | POA: Diagnosis not present

## 2013-11-04 MED ORDER — NAPROXEN 500 MG PO TABS: 500.0000 mg | ORAL_TABLET | Freq: Two times a day (BID) | ORAL | Status: DC
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
## 2013-11-04 NOTE — Discharge Instructions (Signed)
Concussion  A concussion, or closed-head injury, is a brain injury caused by a direct blow to the head or by a quick and sudden movement (jolt) of the head or neck. Concussions are usually not life-threatening. Even so, the effects of a concussion can be serious. If you have had a concussion before, you are more likely to experience concussion-like symptoms after a direct blow to the head.   CAUSES  · Direct blow to the head, such as from running into another player during a soccer game, being hit in a fight, or hitting your head on a hard surface.  · A jolt of the head or neck that causes the brain to move back and forth inside the skull, such as in a car crash.  SIGNS AND SYMPTOMS  The signs of a concussion can be hard to notice. Early on, they may be missed by you, family members, and health care providers. You may look fine but act or feel differently.  Symptoms are usually temporary, but they may last for days, weeks, or even longer. Some symptoms may appear right away while others may not show up for hours or days. Every head injury is different. Symptoms include:  · Mild to moderate headaches that will not go away.  · A feeling of pressure inside your head.  · Having more trouble than usual:  ¨ Learning or remembering things you have heard.  ¨ Answering questions.  ¨ Paying attention or concentrating.  ¨ Organizing daily tasks.  ¨ Making decisions and solving problems.  · Slowness in thinking, acting or reacting, speaking, or reading.  · Getting lost or being easily confused.  · Feeling tired all the time or lacking energy (fatigued).  · Feeling drowsy.  · Sleep disturbances.  ¨ Sleeping more than usual.  ¨ Sleeping less than usual.  ¨ Trouble falling asleep.  ¨ Trouble sleeping (insomnia).  · Loss of balance or feeling lightheaded or dizzy.  · Nausea or vomiting.  · Numbness or tingling.  · Increased sensitivity to:  ¨ Sounds.  ¨ Lights.  ¨ Distractions.  · Vision problems or eyes that tire  easily.  · Diminished sense of taste or smell.  · Ringing in the ears.  · Mood changes such as feeling sad or anxious.  · Becoming easily irritated or angry for little or no reason.  · Lack of motivation.  · Seeing or hearing things other people do not see or hear (hallucinations).  DIAGNOSIS  Your health care provider can usually diagnose a concussion based on a description of your injury and symptoms. He or she will ask whether you passed out (lost consciousness) and whether you are having trouble remembering events that happened right before and during your injury.  Your evaluation might include:  · A brain scan to look for signs of injury to the brain. Even if the test shows no injury, you may still have a concussion.  · Blood tests to be sure other problems are not present.  TREATMENT  · Concussions are usually treated in an emergency department, in urgent care, or at a clinic. You may need to stay in the hospital overnight for further treatment.  · Tell your health care provider if you are taking any medicines, including prescription medicines, over-the-counter medicines, and natural remedies. Some medicines, such as blood thinners (anticoagulants) and aspirin, may increase the chance of complications. Also tell your health care provider whether you have had alcohol or are taking illegal drugs. This information   may affect treatment.  · Your health care provider will send you home with important instructions to follow.  · How fast you will recover from a concussion depends on many factors. These factors include how severe your concussion is, what part of your brain was injured, your age, and how healthy you were before the concussion.  · Most people with mild injuries recover fully. Recovery can take time. In general, recovery is slower in older persons. Also, persons who have had a concussion in the past or have other medical problems may find that it takes longer to recover from their current injury.  HOME  CARE INSTRUCTIONS  General Instructions  · Carefully follow the directions your health care provider gave you.  · Only take over-the-counter or prescription medicines for pain, discomfort, or fever as directed by your health care provider.  · Take only those medicines that your health care provider has approved.  · Do not drink alcohol until your health care provider says you are well enough to do so. Alcohol and certain other drugs may slow your recovery and can put you at risk of further injury.  · If it is harder than usual to remember things, write them down.  · If you are easily distracted, try to do one thing at a time. For example, do not try to watch TV while fixing dinner.  · Talk with family members or close friends when making important decisions.  · Keep all follow-up appointments. Repeated evaluation of your symptoms is recommended for your recovery.  · Watch your symptoms and tell others to do the same. Complications sometimes occur after a concussion. Older adults with a brain injury may have a higher risk of serious complications, such as a blood clot on the brain.  · Tell your teachers, school nurse, school counselor, coach, athletic trainer, or work manager about your injury, symptoms, and restrictions. Tell them about what you can or cannot do. They should watch for:  ¨ Increased problems with attention or concentration.  ¨ Increased difficulty remembering or learning new information.  ¨ Increased time needed to complete tasks or assignments.  ¨ Increased irritability or decreased ability to cope with stress.  ¨ Increased symptoms.  · Rest. Rest helps the brain to heal. Make sure you:  ¨ Get plenty of sleep at night. Avoid staying up late at night.  ¨ Keep the same bedtime hours on weekends and weekdays.  ¨ Rest during the day. Take daytime naps or rest breaks when you feel tired.  · Limit activities that require a lot of thought or concentration. These include:  ¨ Doing homework or job-related  work.  ¨ Watching TV.  ¨ Working on the computer.  · Avoid any situation where there is potential for another head injury (football, hockey, soccer, basketball, martial arts, downhill snow sports and horseback riding). Your condition will get worse every time you experience a concussion. You should avoid these activities until you are evaluated by the appropriate follow-up health care providers.  Returning To Your Regular Activities  You will need to return to your normal activities slowly, not all at once. You must give your body and brain enough time for recovery.  · Do not return to sports or other athletic activities until your health care provider tells you it is safe to do so.  · Ask your health care provider when you can drive, ride a bicycle, or operate heavy machinery. Your ability to react may be slower after a   brain injury. Never do these activities if you are dizzy.  · Ask your health care provider about when you can return to work or school.  Preventing Another Concussion  It is very important to avoid another brain injury, especially before you have recovered. In rare cases, another injury can lead to permanent brain damage, brain swelling, or death. The risk of this is greatest during the first 7-10 days after a head injury. Avoid injuries by:  · Wearing a seat belt when riding in a car.  · Drinking alcohol only in moderation.  · Wearing a helmet when biking, skiing, skateboarding, skating, or doing similar activities.  · Avoiding activities that could lead to a second concussion, such as contact or recreational sports, until your health care provider says it is okay.  · Taking safety measures in your home.  ¨ Remove clutter and tripping hazards from floors and stairways.  ¨ Use grab bars in bathrooms and handrails by stairs.  ¨ Place non-slip mats on floors and in bathtubs.  ¨ Improve lighting in dim areas.  SEEK MEDICAL CARE IF:  · You have increased problems paying attention or  concentrating.  · You have increased difficulty remembering or learning new information.  · You need more time to complete tasks or assignments than before.  · You have increased irritability or decreased ability to cope with stress.  · You have more symptoms than before.  Seek medical care if you have any of the following symptoms for more than 2 weeks after your injury:  · Lasting (chronic) headaches.  · Dizziness or balance problems.  · Nausea.  · Vision problems.  · Increased sensitivity to noise or light.  · Depression or mood swings.  · Anxiety or irritability.  · Memory problems.  · Difficulty concentrating or paying attention.  · Sleep problems.  · Feeling tired all the time.  SEEK IMMEDIATE MEDICAL CARE IF:  · You have severe or worsening headaches. These may be a sign of a blood clot in the brain.  · You have weakness (even if only in one hand, leg, or part of the face).  · You have numbness.  · You have decreased coordination.  · You vomit repeatedly.  · You have increased sleepiness.  · One pupil is larger than the other.  · You have convulsions.  · You have slurred speech.  · You have increased confusion. This may be a sign of a blood clot in the brain.  · You have increased restlessness, agitation, or irritability.  · You are unable to recognize people or places.  · You have neck pain.  · It is difficult to wake you up.  · You have unusual behavior changes.  · You lose consciousness.  MAKE SURE YOU:  · Understand these instructions.  · Will watch your condition.  · Will get help right away if you are not doing well or get worse.  Document Released: 04/06/2003 Document Revised: 01/19/2013 Document Reviewed: 08/06/2012  Exit[employer]® Patient Information ©2015 [employer]. This information is not intended to replace advice given to you by your health care provider. Make sure you discuss any questions you have with your health care provider.
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
## 2013-11-06 NOTE — ED Provider Notes (Signed)
Medical screening examination/treatment/procedure(s) were conducted as a shared visit with non-physician practitioner(s) and myself.  I personally evaluated the patient during the encounter.   EKG Interpretation   Date/Time:  Thursday November 04 2013 14:22:09 EDT Ventricular Rate:  57 PR Interval:  195 QRS Duration: 94 QT Interval:  439 QTC Calculation: 427 R Axis:   7 Text Interpretation:  Sinus rhythm Abnormal R-wave progression, early  transition Borderline ST depression, lateral leads ED PHYSICIAN  INTERPRETATION AVAILABLE IN [facility] Confirmed by TEST, Record  ([id number]) on 11/06/2013 9:58:35 AM       Virgel Manifold, MD 11/06/13 1535

## 2013-11-22 ENCOUNTER — Ambulatory Visit (INDEPENDENT_AMBULATORY_CARE_PROVIDER_SITE_OTHER): Payer: Medicare Other | Admitting: Internal Medicine

## 2013-11-22 VITALS — BP 146/78 | HR 61 | Temp 98.2°F | Resp 18 | Ht 73.0 in | Wt 261.0 lb

## 2013-11-22 DIAGNOSIS — M7021 Olecranon bursitis, right elbow: Secondary | ICD-10-CM

## 2013-11-22 DIAGNOSIS — M7020 Olecranon bursitis, unspecified elbow: Secondary | ICD-10-CM | POA: Insufficient documentation

## 2013-11-22 MED ORDER — METHYLPREDNISOLONE ACETATE 40 MG/ML IJ SUSP
40.0000 mg | Freq: Once | INTRAMUSCULAR | Status: AC
  Administered 2013-11-22: 40 mg via INTRA_ARTICULAR

## 2013-11-22 NOTE — Patient Instructions (Signed)
Olecranon Bursitis Bursitis is swelling and soreness (inflammation) of a fluid-filled sac (bursa) that covers and protects a joint. Olecranon bursitis occurs over the elbow.  CAUSES Bursitis can be caused by injury, overuse of the joint, arthritis, or infection.  SYMPTOMS   Tenderness, swelling, warmth, or redness over the elbow.  Elbow pain with movement. This is greater with bending the elbow.  Squeaking sound when the bursa is rubbed or moved.  Increasing size of the bursa without pain or discomfort.  Fever with increasing pain and swelling if the bursa becomes infected. HOME CARE INSTRUCTIONS   Put ice on the affected area.  Put ice in a plastic bag.  Place a towel between your skin and the bag.  Leave the ice on for 15-20 minutes each hour while awake. Do this for the first 2 days.  When resting, elevate your elbow above the level of your heart. This helps reduce swelling.  Continue to put the joint through a full range of motion 4 times per day. Rest the injured joint at other times. When the pain lessens, begin normal slow movements and usual activities.  Only take over-the-counter or prescription medicines for pain, discomfort, or fever as directed by your caregiver.  Reduce your intake of milk and related dairy products (cheese, yogurt). They may make your condition worse. SEEK IMMEDIATE MEDICAL CARE IF:   Your pain increases even during treatment.  You have a fever.  You have heat and inflammation over the bursa and elbow.  You have a red line that goes up your arm.  You have pain with movement of your elbow. MAKE SURE YOU:   Understand these instructions.  Will watch your condition.  Will get help right away if you are not doing well or get worse. Document Released: 02/13/2006 Document Revised: 04/08/2011 Document Reviewed: 12/30/2006 ExitCare Patient Information 2015 [employer]. This information is not intended to replace advice given to you by your  health care provider. Make sure you discuss any questions you have with your health care provider.  

## 2013-11-22 NOTE — Progress Notes (Signed)
   Subjective:    Patient ID: Allen Cervantes, male    DOB: [birth year], 73 y.o.   MRN: [id number]  HPI  Patient is here complaining of ongoing swelling in the right elbow. He states the swelling has remained constant, but has not worsened. He states it is not painful, but is slightly uncomfortable. He did as he was instructed at the last visit; to ice his elbow and take ibuprofen with no relief. He notes he would like to explore the option of draining the fluid off of his elbow, as it has become rather bothersome. This swelling has not effected his ROM. No red, heat or pain. Has simple fluid in bursae. Healyhy, no hx of blood disorder, heart or lung disorder.  Review of Systems     Objective:   Physical Exam  Constitutional: He is oriented to person, place, and time. He appears well-developed and well-nourished. No distress.  HENT:  Head: Normocephalic.  Eyes: EOM are normal. Pupils are equal, round, and reactive to light. No scleral icterus.  Neck: Normal range of motion.  Cardiovascular: Normal rate.   Pulmonary/Chest: Effort normal.  Musculoskeletal: He exhibits no edema.       Right elbow: He exhibits swelling and deformity. He exhibits normal range of motion, no effusion and no laceration. Tenderness found. Olecranon process tenderness noted.  Sterile prep Xylocain local Aspirate bursae inject 1/2 cc depomedrol--20mg   Neurological: He is alert and oriented to person, place, and time. He exhibits normal muscle tone. Coordination normal.  Skin: No erythema.  Psychiatric: He has a normal mood and affect.    Aspirated 14cc serosanguinous thin fluid. Injected small dose depomedrol Sterile dressing      Assessment & Plan:  Olecranon bursitis no infection Aspirate bursae/sterile dressing/RICE/Ace Refer to ortho if recurs

## 2014-01-03 DIAGNOSIS — Z1389 Encounter for screening for other disorder: Secondary | ICD-10-CM | POA: Diagnosis not present

## 2014-01-03 DIAGNOSIS — K219 Gastro-esophageal reflux disease without esophagitis: Secondary | ICD-10-CM | POA: Diagnosis not present

## 2014-01-03 DIAGNOSIS — Z23 Encounter for immunization: Secondary | ICD-10-CM | POA: Diagnosis not present

## 2014-01-03 DIAGNOSIS — Z0000 Encounter for general adult medical examination without abnormal findings: Secondary | ICD-10-CM | POA: Diagnosis not present

## 2014-01-03 DIAGNOSIS — Z125 Encounter for screening for malignant neoplasm of prostate: Secondary | ICD-10-CM | POA: Diagnosis not present

## 2014-01-18 DIAGNOSIS — R221 Localized swelling, mass and lump, neck: Secondary | ICD-10-CM | POA: Diagnosis not present

## 2014-02-02 ENCOUNTER — Other Ambulatory Visit: Payer: Self-pay | Admitting: Internal Medicine

## 2014-02-02 DIAGNOSIS — R221 Localized swelling, mass and lump, neck: Principal | ICD-10-CM

## 2014-02-02 DIAGNOSIS — J392 Other diseases of pharynx: Secondary | ICD-10-CM

## 2014-02-04 ENCOUNTER — Ambulatory Visit
Admission: RE | Admit: 2014-02-04 | Discharge: 2014-02-04 | Disposition: A | Discharge status: Home / Self Care | Payer: Medicare Other | Source: Ambulatory Visit | Attending: Internal Medicine | Admitting: Internal Medicine

## 2014-02-04 DIAGNOSIS — R221 Localized swelling, mass and lump, neck: Principal | ICD-10-CM

## 2014-02-04 DIAGNOSIS — J392 Other diseases of pharynx: Secondary | ICD-10-CM

## 2014-02-04 IMAGING — CT CT NECK W/ CM
4 of 5 series · 16 of 33 positions shown, 19 images · IV contrast (75CC OMNI 300)
Comparison: Maxillofacial CT [DATE]

CLINICAL DATA: Throat mass seen on [DATE] maxillofacial CT.

EXAM:
CT NECK WITH CONTRAST
TECHNIQUE: Multidetector CT imaging of the neck was performed using the
standard protocol following the bolus administration of intravenous
contrast.
CONTRAST:  75mL OMNIPAQUE IOHEXOL 300 MG/ML  SOLN

[Series 2: axial neck · axial · 0.47mm/px · z∈[-2,+90]mm · 3 of 93 slices shown]
[im 19/93  bone]
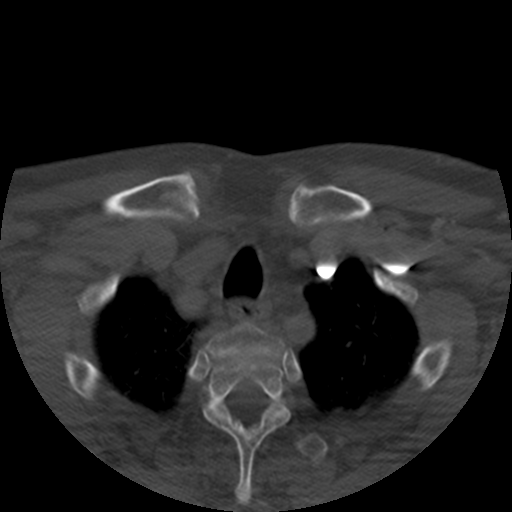
[im 37/93  bone]
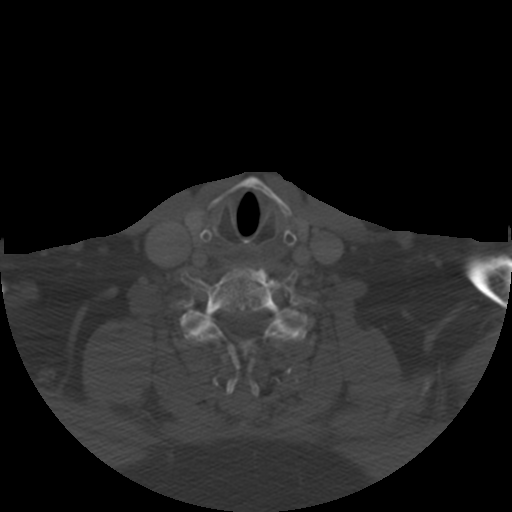
[im 56/93  bone]
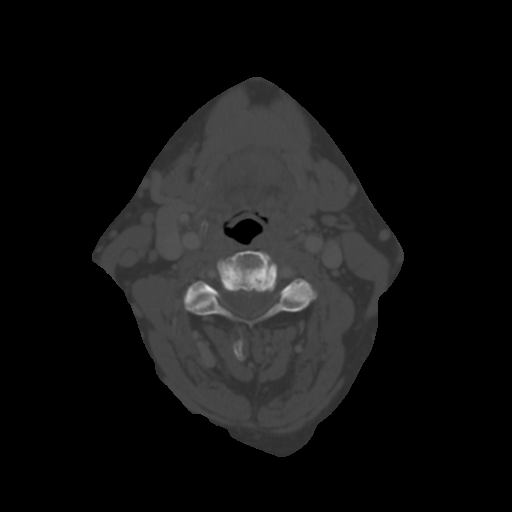

[Series 400: cor · coronal · 0.47mm/px · 3 of 113 slices shown]
[im 23/113  bone]
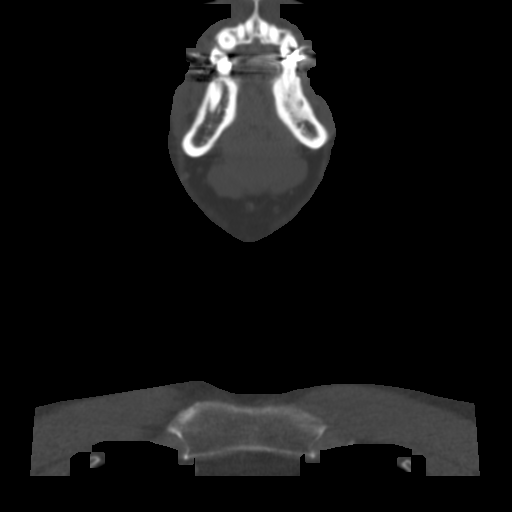
[im 45/113  bone]
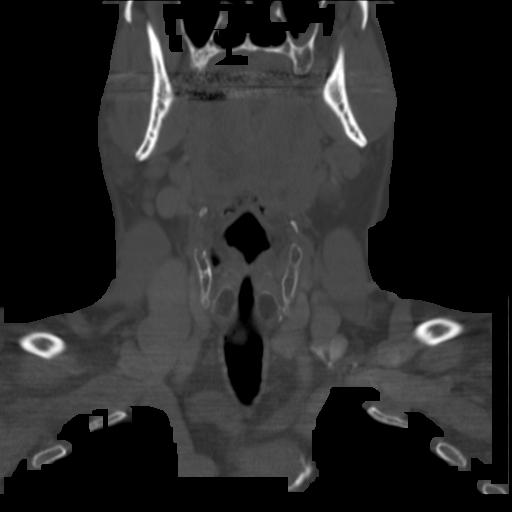
[im 68/113  bone]
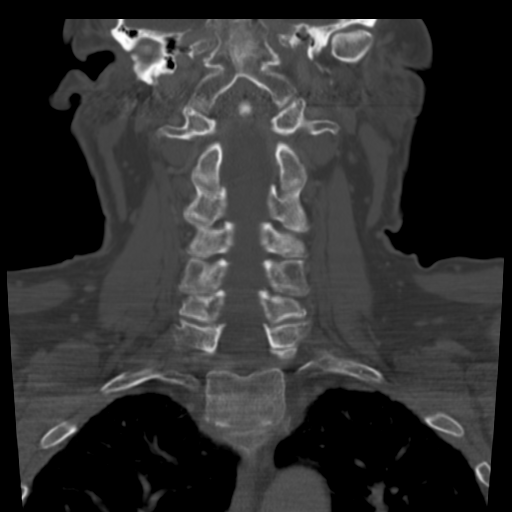

[Series 401: angled ax · axial · 0.47mm/px · z∈[-44,+106]mm · 5 of 119 slices shown, 7 images]
[im 20/119  soft-tissue]
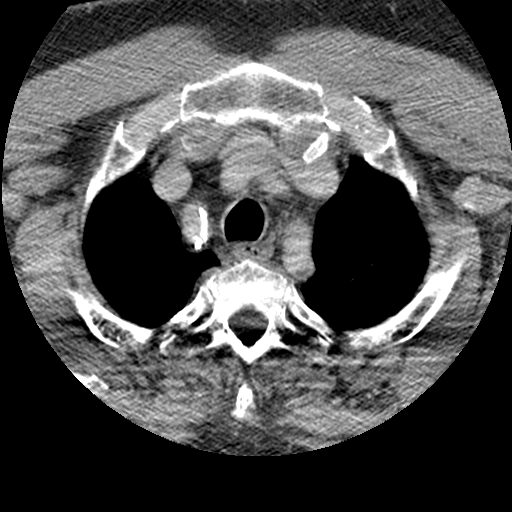
[im 20/119  bone]
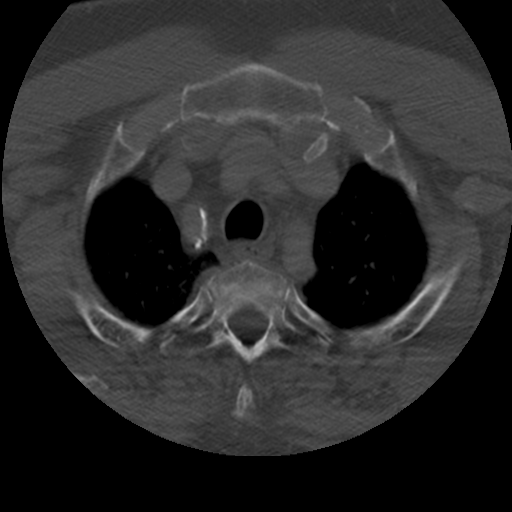
[im 40/119  bone]
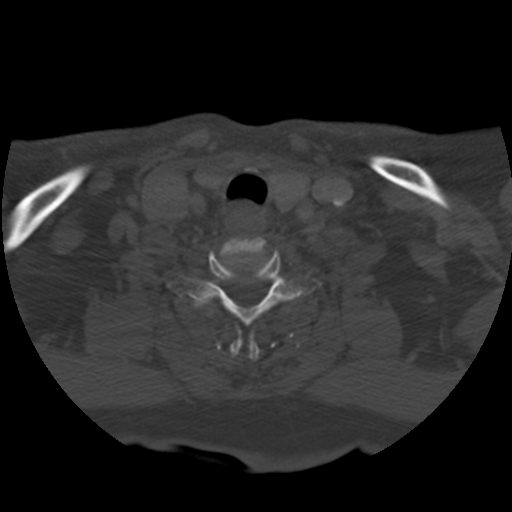
[im 60/119  bone]
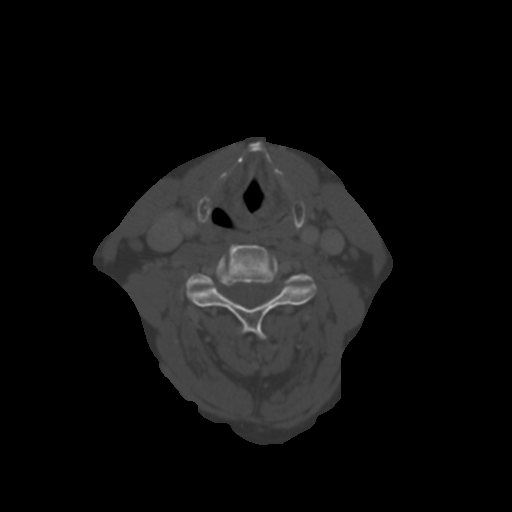
[im 79/119  bone]
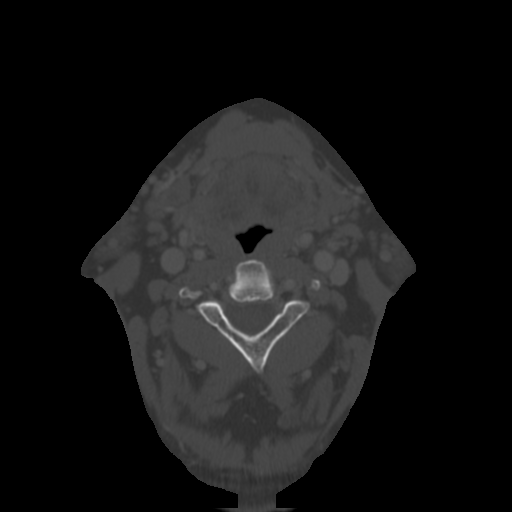
[im 99/119  soft-tissue]
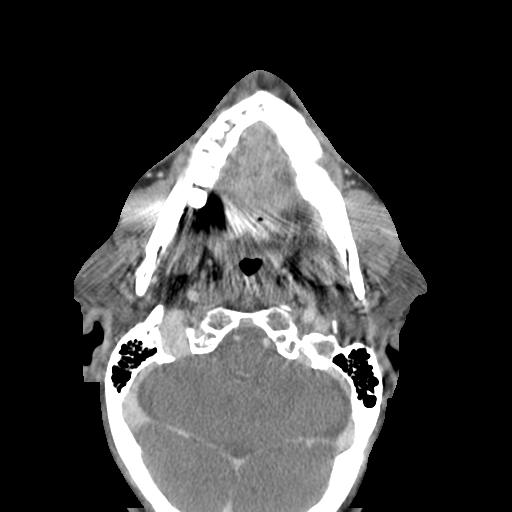
[im 99/119  bone]
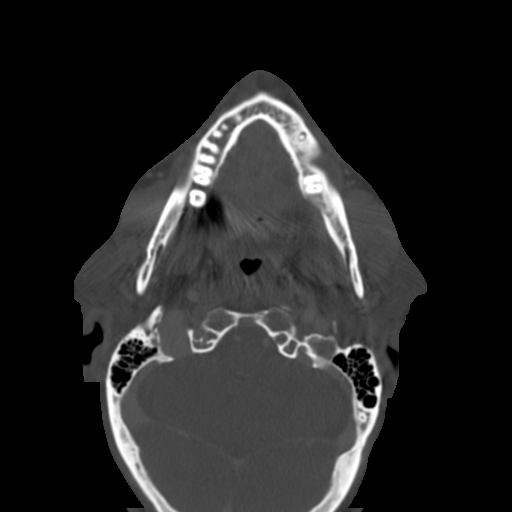

[Series 402: sag · sagittal · 0.47mm/px · 5 of 116 slices shown, 6 images]
[im 39/116  bone]
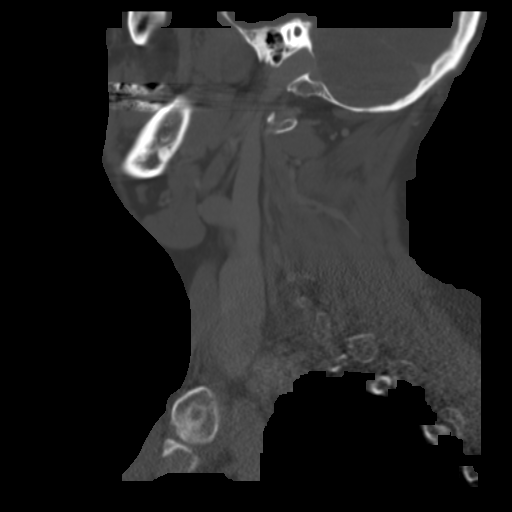
[im 48/116  bone]
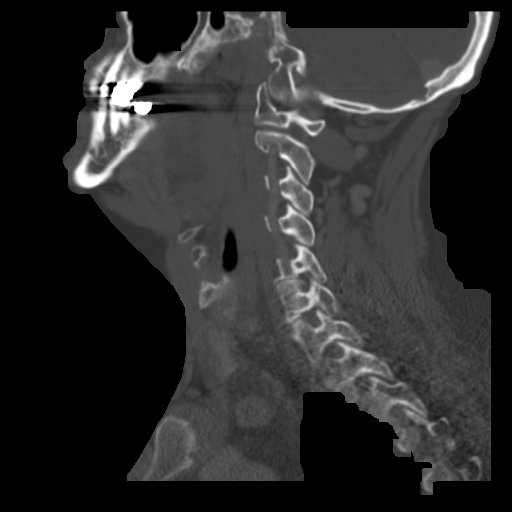
[im 58/116  soft-tissue]
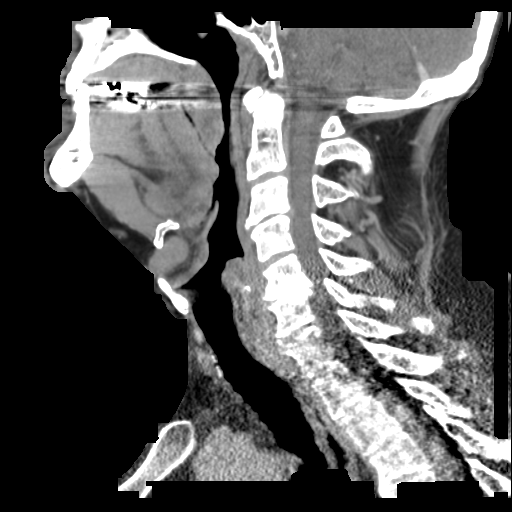
[im 58/116  bone]
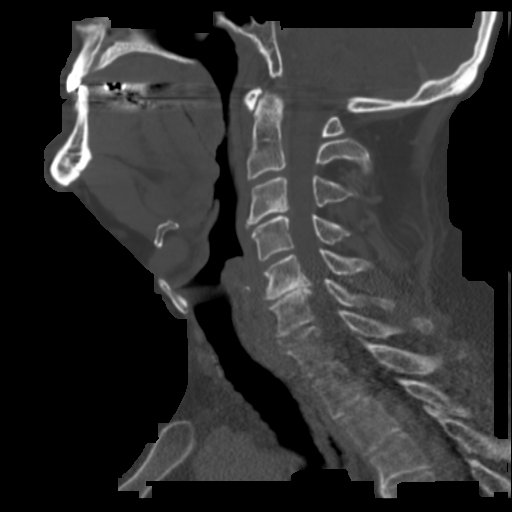
[im 68/116  bone]
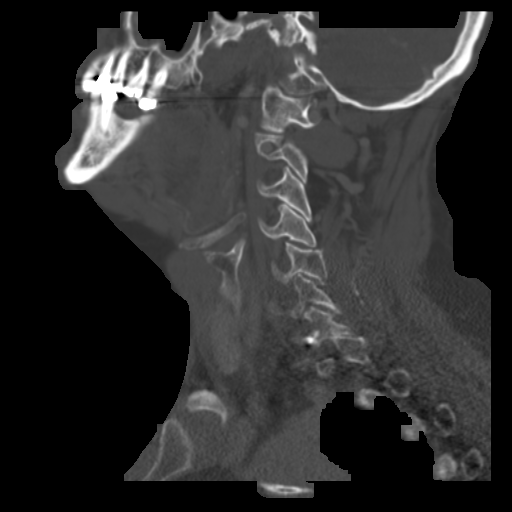
[im 77/116  bone]
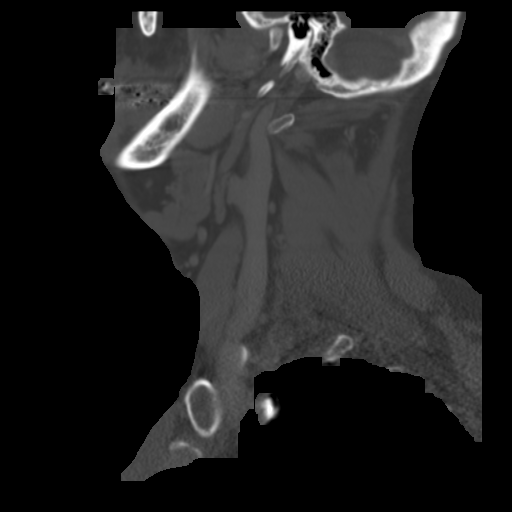

[16 of 33 positions shown; findings below may reference images not displayed]

FINDINGS: Pharynx and larynx: The nasopharynx, oropharynx, oral cavity, and
larynx are unremarkable. As partially visualized on prior
maxillofacial CT, there is an oval, well-circumscribed, homogeneous
soft tissue mass in the pre epiglottic space which extends
anteriorly along the undersurface of the hyoid bone in the midline
of the upper neck. This measures 2.3 x 1.2 x 1.4 cm. No destructive
changes are seen in the adjacent hyoid bone or thyroid cartilage.

Salivary glands: Submandibular and parotid glands are unremarkable.

Thyroid: Unremarkable.

Lymph nodes: No enlarged lymph nodes identified in the neck.

Vascular: Major vascular structures of the neck are patent with mild
left ICA atherosclerosis noted.

Limited intracranial: Visualized portion of the brain is
unremarkable.

Mastoids and visualized paranasal sinuses: Mild mucosal thickening
and a small amount fluid are partially visualized in the left
maxillary sinus. Visualized mastoid air cells are clear.

Skeleton: Multilevel cervical spondylosis, worst at C5-6 with there
is likely moderate to severe spinal stenosis and moderate to severe
right and severe left neural foraminal stenosis.

Upper chest: Unremarkable aside from mild aortic arch calcification.
IMPRESSION: 2.3 cm mass situated between the anterior hyoid bone and thyroid
cartilage, most consistent with a benign thyroglossal duct remnant.

## 2014-02-04 MED ORDER — IOHEXOL 300 MG/ML  SOLN
75.0000 mL | Freq: Once | INTRAMUSCULAR | Status: AC | PRN
  Administered 2014-02-04: 75 mL via INTRAVENOUS

## 2014-04-01 DIAGNOSIS — J00 Acute nasopharyngitis [common cold]: Secondary | ICD-10-CM | POA: Diagnosis not present

## 2014-04-01 DIAGNOSIS — R05 Cough: Secondary | ICD-10-CM | POA: Diagnosis not present

## 2014-04-25 DIAGNOSIS — J387 Other diseases of larynx: Secondary | ICD-10-CM | POA: Diagnosis not present

## 2014-04-25 DIAGNOSIS — J3089 Other allergic rhinitis: Secondary | ICD-10-CM | POA: Diagnosis not present

## 2014-04-25 DIAGNOSIS — Q892 Congenital malformations of other endocrine glands: Secondary | ICD-10-CM | POA: Diagnosis not present

## 2014-05-06 DIAGNOSIS — H4011X1 Primary open-angle glaucoma, mild stage: Secondary | ICD-10-CM | POA: Diagnosis not present

## 2014-05-06 DIAGNOSIS — H4011X3 Primary open-angle glaucoma, severe stage: Secondary | ICD-10-CM | POA: Diagnosis not present

## 2014-05-27 DIAGNOSIS — M5432 Sciatica, left side: Secondary | ICD-10-CM | POA: Diagnosis not present

## 2014-05-30 DIAGNOSIS — G4733 Obstructive sleep apnea (adult) (pediatric): Secondary | ICD-10-CM | POA: Diagnosis not present

## 2014-05-30 DIAGNOSIS — Q892 Congenital malformations of other endocrine glands: Secondary | ICD-10-CM | POA: Diagnosis not present

## 2014-05-30 DIAGNOSIS — Z87891 Personal history of nicotine dependence: Secondary | ICD-10-CM | POA: Diagnosis not present

## 2014-12-16 DIAGNOSIS — M13172 Monoarthritis, not elsewhere classified, left ankle and foot: Secondary | ICD-10-CM | POA: Diagnosis not present

## 2014-12-16 DIAGNOSIS — Z23 Encounter for immunization: Secondary | ICD-10-CM | POA: Diagnosis not present

## 2014-12-16 DIAGNOSIS — R601 Generalized edema: Secondary | ICD-10-CM | POA: Diagnosis not present

## 2014-12-16 DIAGNOSIS — R600 Localized edema: Secondary | ICD-10-CM | POA: Diagnosis not present

## 2015-01-06 DIAGNOSIS — Z23 Encounter for immunization: Secondary | ICD-10-CM | POA: Diagnosis not present

## 2015-01-06 DIAGNOSIS — E669 Obesity, unspecified: Secondary | ICD-10-CM | POA: Diagnosis not present

## 2015-01-06 DIAGNOSIS — K219 Gastro-esophageal reflux disease without esophagitis: Secondary | ICD-10-CM | POA: Diagnosis not present

## 2015-01-06 DIAGNOSIS — Z0000 Encounter for general adult medical examination without abnormal findings: Secondary | ICD-10-CM | POA: Diagnosis not present

## 2015-01-06 DIAGNOSIS — Z5181 Encounter for therapeutic drug level monitoring: Secondary | ICD-10-CM | POA: Diagnosis not present

## 2015-01-06 DIAGNOSIS — Z1389 Encounter for screening for other disorder: Secondary | ICD-10-CM | POA: Diagnosis not present

## 2015-01-06 DIAGNOSIS — E7800 Pure hypercholesterolemia, unspecified: Secondary | ICD-10-CM | POA: Diagnosis not present

## 2015-01-06 DIAGNOSIS — Z6835 Body mass index (BMI) 35.0-35.9, adult: Secondary | ICD-10-CM | POA: Diagnosis not present

## 2015-01-06 DIAGNOSIS — Q892 Congenital malformations of other endocrine glands: Secondary | ICD-10-CM | POA: Diagnosis not present

## 2015-08-17 ENCOUNTER — Other Ambulatory Visit: Payer: Self-pay | Admitting: Internal Medicine

## 2015-08-17 ENCOUNTER — Ambulatory Visit
Admission: RE | Admit: 2015-08-17 | Discharge: 2015-08-17 | Disposition: A | Discharge status: Home / Self Care | Payer: Medicare Other | Source: Ambulatory Visit | Attending: Internal Medicine | Admitting: Internal Medicine

## 2015-08-17 DIAGNOSIS — M7918 Myalgia, other site: Secondary | ICD-10-CM

## 2015-08-17 DIAGNOSIS — M545 Low back pain: Secondary | ICD-10-CM

## 2015-08-17 DIAGNOSIS — M5126 Other intervertebral disc displacement, lumbar region: Secondary | ICD-10-CM | POA: Diagnosis not present

## 2015-08-17 DIAGNOSIS — M5442 Lumbago with sciatica, left side: Secondary | ICD-10-CM | POA: Diagnosis not present

## 2015-08-17 DIAGNOSIS — M79652 Pain in left thigh: Secondary | ICD-10-CM

## 2015-08-23 DIAGNOSIS — M5126 Other intervertebral disc displacement, lumbar region: Secondary | ICD-10-CM | POA: Diagnosis not present

## 2015-08-23 DIAGNOSIS — R030 Elevated blood-pressure reading, without diagnosis of hypertension: Secondary | ICD-10-CM | POA: Diagnosis not present

## 2015-08-23 DIAGNOSIS — M4726 Other spondylosis with radiculopathy, lumbar region: Secondary | ICD-10-CM | POA: Diagnosis not present

## 2015-08-23 DIAGNOSIS — Z6833 Body mass index (BMI) 33.0-33.9, adult: Secondary | ICD-10-CM | POA: Diagnosis not present

## 2015-08-24 ENCOUNTER — Other Ambulatory Visit: Payer: Self-pay | Admitting: Neurosurgery

## 2015-08-24 DIAGNOSIS — M5126 Other intervertebral disc displacement, lumbar region: Secondary | ICD-10-CM

## 2015-08-25 ENCOUNTER — Ambulatory Visit
Admission: RE | Admit: 2015-08-25 | Discharge: 2015-08-25 | Disposition: A | Discharge status: Home / Self Care | Payer: Medicare Other | Source: Ambulatory Visit | Attending: Neurosurgery | Admitting: Neurosurgery

## 2015-08-25 DIAGNOSIS — M5126 Other intervertebral disc displacement, lumbar region: Secondary | ICD-10-CM

## 2015-08-25 MED ORDER — IOPAMIDOL (ISOVUE-M 200) INJECTION 41%
1.0000 mL | Freq: Once | INTRAMUSCULAR | Status: AC
  Administered 2015-08-25: 1 mL via EPIDURAL

## 2015-08-25 MED ORDER — METHYLPREDNISOLONE ACETATE 40 MG/ML INJ SUSP (RADIOLOG
120.0000 mg | Freq: Once | INTRAMUSCULAR | Status: AC
  Administered 2015-08-25: 120 mg via EPIDURAL

## 2015-08-25 NOTE — Discharge Instructions (Signed)

## 2015-09-01 DIAGNOSIS — H401131 Primary open-angle glaucoma, bilateral, mild stage: Secondary | ICD-10-CM | POA: Diagnosis not present

## 2015-10-27 DIAGNOSIS — X32XXXA Exposure to sunlight, initial encounter: Secondary | ICD-10-CM | POA: Diagnosis not present

## 2015-10-27 DIAGNOSIS — L570 Actinic keratosis: Secondary | ICD-10-CM | POA: Diagnosis not present

## 2015-10-27 DIAGNOSIS — Z08 Encounter for follow-up examination after completed treatment for malignant neoplasm: Secondary | ICD-10-CM | POA: Diagnosis not present

## 2015-10-27 DIAGNOSIS — Z85828 Personal history of other malignant neoplasm of skin: Secondary | ICD-10-CM | POA: Diagnosis not present

## 2016-03-01 DIAGNOSIS — K2270 Barrett's esophagus without dysplasia: Secondary | ICD-10-CM | POA: Diagnosis not present

## 2016-03-01 DIAGNOSIS — K219 Gastro-esophageal reflux disease without esophagitis: Secondary | ICD-10-CM | POA: Diagnosis not present

## 2016-03-01 DIAGNOSIS — Z23 Encounter for immunization: Secondary | ICD-10-CM | POA: Diagnosis not present

## 2017-03-07 DIAGNOSIS — H401131 Primary open-angle glaucoma, bilateral, mild stage: Secondary | ICD-10-CM | POA: Diagnosis not present

## 2017-03-07 DIAGNOSIS — H35371 Puckering of macula, right eye: Secondary | ICD-10-CM | POA: Diagnosis not present

## 2017-03-07 DIAGNOSIS — H1852 Epithelial (juvenile) corneal dystrophy: Secondary | ICD-10-CM | POA: Diagnosis not present

## 2017-03-10 DIAGNOSIS — Z23 Encounter for immunization: Secondary | ICD-10-CM | POA: Diagnosis not present

## 2017-03-10 DIAGNOSIS — R972 Elevated prostate specific antigen [PSA]: Secondary | ICD-10-CM | POA: Diagnosis not present

## 2017-03-10 DIAGNOSIS — E7800 Pure hypercholesterolemia, unspecified: Secondary | ICD-10-CM | POA: Diagnosis not present

## 2017-03-10 DIAGNOSIS — E669 Obesity, unspecified: Secondary | ICD-10-CM | POA: Diagnosis not present

## 2017-03-10 DIAGNOSIS — B351 Tinea unguium: Secondary | ICD-10-CM | POA: Diagnosis not present

## 2017-03-10 DIAGNOSIS — Z0000 Encounter for general adult medical examination without abnormal findings: Secondary | ICD-10-CM | POA: Diagnosis not present

## 2017-03-10 DIAGNOSIS — K2270 Barrett's esophagus without dysplasia: Secondary | ICD-10-CM | POA: Diagnosis not present

## 2017-03-10 DIAGNOSIS — Z125 Encounter for screening for malignant neoplasm of prostate: Secondary | ICD-10-CM | POA: Diagnosis not present

## 2017-03-10 DIAGNOSIS — K219 Gastro-esophageal reflux disease without esophagitis: Secondary | ICD-10-CM | POA: Diagnosis not present

## 2017-03-10 DIAGNOSIS — Z1389 Encounter for screening for other disorder: Secondary | ICD-10-CM | POA: Diagnosis not present

## 2017-03-21 ENCOUNTER — Ambulatory Visit (INDEPENDENT_AMBULATORY_CARE_PROVIDER_SITE_OTHER): Payer: Medicare Other | Admitting: Podiatry

## 2017-03-21 ENCOUNTER — Encounter: Payer: Self-pay | Admitting: Podiatry

## 2017-03-21 DIAGNOSIS — L603 Nail dystrophy: Secondary | ICD-10-CM

## 2017-03-21 NOTE — Progress Notes (Signed)
   Subjective:    Patient ID: Allen Cervantes, male    DOB: 19-Aug-1940, 77 y.o.   MRN: [id number]  HPI   I was referred by Dr Laurann Montana and the right big toenail has been this way for about 6 months and I am not a diabetic and I have trimmed on it and I had an injury with the right big toenail many many years ago   Review of Systems  All other systems reviewed and are negative.      Objective:   Physical Exam        Assessment & Plan:

## 2017-03-21 NOTE — Progress Notes (Signed)
Subjective:   Patient ID: Allen Cervantes, male   DOB: 77 y.o.   MRN: [id number]   HPI 77 year old male presents the office today for concerns of an issue to his right big toenail which is been ongoing about 6 months.  He states that the nail will get thick and he trims it once a week as it will grow into the scanner.  He states that the nail itself is been discolored and thick.  Denies any drainage or pus or any redness and he denies any recent injury but he had an injury to the toe several years ago when he was a child. No other concerns.    Review of Systems  All other systems reviewed and are negative.       Objective:  Physical Exam  General: AAO x3, NAD  Dermatological: On the right hallux there is minimal toenail present on the base of the nail.  There is dry, callused skin present to the nail bed.  There does appear to be spicules of the toenail within the corners of the nail.  There is no tenderness palpation of the nail there is no surrounding redness or drainage or any swelling or any clinical signs of infection.  No open lesions identified bilaterally.  Vascular: Dorsalis Pedis artery and Posterior Tibial artery pedal pulses are 2/4 bilateral with immedate capillary fill time. Pedal hair growth present. There is no pain with calf compression, swelling, warmth, erythema.   Neruologic: Grossly intact via light touch bilateral.  Protective threshold with Semmes Wienstein monofilament intact to all pedal sites bilateral.   Musculoskeletal: No gross boney pedal deformities bilateral. No pain, crepitus, or limitation noted with foot and ankle range of motion bilateral. Muscular strength 5/5 in all groups tested bilateral.  Gait: Unassisted, Nonantalgic.       Assessment:   Right hallux onychodystrophy     Plan:  -Treatment options discussed including all alternatives, risks, and complications -Etiology of symptoms were discussed -At this point there is minimal toenail but  there are pieces the toenail still present.  I discussed with him total nail avulsion with chemical matricectomy to remove the remainder of the toenail and to clean the nail bed.  Discussed this is not a guarantee resolution of symptoms and he may still have some issues however I think at this point this would be the best option for him.  Also discussed medications that we can try but I do not think will be helpful at this point as this is a very damaged toenail only minimal nail is remaining.  Monitor for any signs or symptoms of infection.  He will be rescheduled at his request for the procedure as he cannot do it today as he is going out of town.  He will look at his calendar to get scheduled.  Trula Slade DPM

## 2017-04-04 DIAGNOSIS — K219 Gastro-esophageal reflux disease without esophagitis: Secondary | ICD-10-CM | POA: Diagnosis not present

## 2017-04-04 DIAGNOSIS — K2270 Barrett's esophagus without dysplasia: Secondary | ICD-10-CM | POA: Diagnosis not present

## 2017-06-06 DIAGNOSIS — K2270 Barrett's esophagus without dysplasia: Secondary | ICD-10-CM | POA: Diagnosis not present

## 2017-06-06 DIAGNOSIS — K317 Polyp of stomach and duodenum: Secondary | ICD-10-CM | POA: Diagnosis not present

## 2017-06-06 DIAGNOSIS — K449 Diaphragmatic hernia without obstruction or gangrene: Secondary | ICD-10-CM | POA: Diagnosis not present

## 2017-06-12 DIAGNOSIS — K2270 Barrett's esophagus without dysplasia: Secondary | ICD-10-CM | POA: Diagnosis not present

## 2017-06-12 DIAGNOSIS — K317 Polyp of stomach and duodenum: Secondary | ICD-10-CM | POA: Diagnosis not present

## 2017-06-24 DIAGNOSIS — X32XXXD Exposure to sunlight, subsequent encounter: Secondary | ICD-10-CM | POA: Diagnosis not present

## 2017-06-24 DIAGNOSIS — C44519 Basal cell carcinoma of skin of other part of trunk: Secondary | ICD-10-CM | POA: Diagnosis not present

## 2017-06-24 DIAGNOSIS — L821 Other seborrheic keratosis: Secondary | ICD-10-CM | POA: Diagnosis not present

## 2017-06-24 DIAGNOSIS — Z1283 Encounter for screening for malignant neoplasm of skin: Secondary | ICD-10-CM | POA: Diagnosis not present

## 2017-06-24 DIAGNOSIS — L570 Actinic keratosis: Secondary | ICD-10-CM | POA: Diagnosis not present

## 2017-08-08 DIAGNOSIS — C44519 Basal cell carcinoma of skin of other part of trunk: Secondary | ICD-10-CM | POA: Diagnosis not present

## 2017-08-08 DIAGNOSIS — Z08 Encounter for follow-up examination after completed treatment for malignant neoplasm: Secondary | ICD-10-CM | POA: Diagnosis not present

## 2017-08-08 DIAGNOSIS — Z85828 Personal history of other malignant neoplasm of skin: Secondary | ICD-10-CM | POA: Diagnosis not present

## 2017-08-08 DIAGNOSIS — R972 Elevated prostate specific antigen [PSA]: Secondary | ICD-10-CM | POA: Diagnosis not present

## 2017-08-22 DIAGNOSIS — I872 Venous insufficiency (chronic) (peripheral): Secondary | ICD-10-CM | POA: Diagnosis not present

## 2017-08-22 DIAGNOSIS — Z23 Encounter for immunization: Secondary | ICD-10-CM | POA: Diagnosis not present

## 2017-08-27 ENCOUNTER — Other Ambulatory Visit: Payer: Self-pay

## 2017-08-27 DIAGNOSIS — I872 Venous insufficiency (chronic) (peripheral): Secondary | ICD-10-CM

## 2017-09-12 DIAGNOSIS — N3000 Acute cystitis without hematuria: Secondary | ICD-10-CM | POA: Diagnosis not present

## 2017-09-12 DIAGNOSIS — N410 Acute prostatitis: Secondary | ICD-10-CM | POA: Diagnosis not present

## 2017-09-12 DIAGNOSIS — R972 Elevated prostate specific antigen [PSA]: Secondary | ICD-10-CM | POA: Diagnosis not present

## 2017-09-26 ENCOUNTER — Other Ambulatory Visit: Payer: Self-pay | Admitting: Internal Medicine

## 2017-09-26 ENCOUNTER — Ambulatory Visit (HOSPITAL_COMMUNITY)
Admission: RE | Admit: 2017-09-26 | Discharge: 2017-09-26 | Disposition: A | Discharge status: Home / Self Care | Payer: Medicare Other | Source: Ambulatory Visit | Attending: Internal Medicine | Admitting: Internal Medicine

## 2017-09-26 DIAGNOSIS — M79604 Pain in right leg: Secondary | ICD-10-CM | POA: Diagnosis not present

## 2017-09-26 DIAGNOSIS — M7989 Other specified soft tissue disorders: Secondary | ICD-10-CM

## 2017-09-26 DIAGNOSIS — I872 Venous insufficiency (chronic) (peripheral): Secondary | ICD-10-CM | POA: Insufficient documentation

## 2017-09-26 DIAGNOSIS — R609 Edema, unspecified: Secondary | ICD-10-CM | POA: Diagnosis not present

## 2017-09-26 NOTE — Progress Notes (Signed)
Right lower extremity venous duplex has been completed. Negative for DVT. Results were given to Dr. Lysle Rubens.  09/26/17 1:23 PM Allen Cervantes RVT

## 2017-10-06 DIAGNOSIS — H401131 Primary open-angle glaucoma, bilateral, mild stage: Secondary | ICD-10-CM | POA: Diagnosis not present

## 2017-10-08 DIAGNOSIS — M549 Dorsalgia, unspecified: Secondary | ICD-10-CM | POA: Diagnosis not present

## 2017-10-08 DIAGNOSIS — R609 Edema, unspecified: Secondary | ICD-10-CM | POA: Diagnosis not present

## 2017-10-17 ENCOUNTER — Encounter: Payer: Medicare Other | Admitting: Vascular Surgery

## 2017-10-17 ENCOUNTER — Ambulatory Visit (INDEPENDENT_AMBULATORY_CARE_PROVIDER_SITE_OTHER): Payer: Medicare Other | Admitting: Vascular Surgery

## 2017-10-17 ENCOUNTER — Encounter (HOSPITAL_COMMUNITY): Payer: Medicare Other

## 2017-10-17 ENCOUNTER — Encounter (INDEPENDENT_AMBULATORY_CARE_PROVIDER_SITE_OTHER): Payer: Self-pay

## 2017-10-17 ENCOUNTER — Other Ambulatory Visit: Payer: Self-pay

## 2017-10-17 ENCOUNTER — Encounter: Payer: Self-pay | Admitting: Vascular Surgery

## 2017-10-17 ENCOUNTER — Ambulatory Visit (HOSPITAL_COMMUNITY)
Admission: RE | Admit: 2017-10-17 | Discharge: 2017-10-17 | Disposition: A | Discharge status: Home / Self Care | Payer: Medicare Other | Source: Ambulatory Visit | Attending: Vascular Surgery | Admitting: Vascular Surgery

## 2017-10-17 VITALS — BP 155/82 | HR 45 | Resp 20 | Ht 73.0 in | Wt 266.1 lb

## 2017-10-17 DIAGNOSIS — I872 Venous insufficiency (chronic) (peripheral): Secondary | ICD-10-CM | POA: Diagnosis not present

## 2017-10-17 DIAGNOSIS — M7989 Other specified soft tissue disorders: Secondary | ICD-10-CM

## 2017-10-17 DIAGNOSIS — M79604 Pain in right leg: Secondary | ICD-10-CM | POA: Diagnosis not present

## 2017-10-17 NOTE — Progress Notes (Signed)
Patient ID: Allen Cervantes, male   DOB: September 26, 1940, 77 y.o.   MRN: [id number]  Reason for Consult: New Patient (Initial Visit) (eval chronic venous insuff - Dr. Lavone Orn)   Referred by Lavone Orn, MD  Subjective:     HPI:  Allen Cervantes is a 77 y.o. male presents for evaluation of right lower extremity swelling.  He has had this swelling for many years.  He is previously undergone phlebectomy of right lower extremity varicose veins and possibly had some sort of procedure on his saphenous vein.  He has never had any left lower extremity symptoms or procedures.  He does not have ulceration.  He did have an injury to his right lower extremity which resulted in hematoma.  He has never had surgery on his right lower extremity only surgery was a abdominal surgery as a child.  He is able to walk.  He does not have tissue loss or ulceration.  He does occasionally wear compression stockings but states that these are not really helping at this time.  Past Medical History:  Diagnosis Date  . Barrett's esophagus   . GERD (gastroesophageal reflux disease)   . Glaucoma    both eyes   History reviewed. No pertinent family history. Past Surgical History:  Procedure Laterality Date  . APPENDECTOMY  age 29  . COLONOSCOPY WITH PROPOFOL N/A 10/27/2012   Procedure: COLONOSCOPY WITH PROPOFOL;  Surgeon: Garlan Fair, MD;  Location: [facility];  Service: Endoscopy;  Laterality: N/A;  . ESOPHAGOGASTRODUODENOSCOPY (EGD) WITH PROPOFOL N/A 10/27/2012   Procedure: ESOPHAGOGASTRODUODENOSCOPY (EGD) WITH PROPOFOL;  Surgeon: Garlan Fair, MD;  Location: [facility];  Service: Endoscopy;  Laterality: N/A;  . omentum lining of stomach  age 28    Short Social History:  Social History   Tobacco Use  . Smoking status: Former Smoker    Packs/day: 1.00    Years: 10.00    Pack years: 10.00    Types: Cigarettes  . Smokeless tobacco: Never Used  . Tobacco comment: quit smoking 40 yrs agio  Substance  Use Topics  . Alcohol use: Yes    Comment: wine glass 1-2 per day    No Known Allergies  Current Outpatient Medications  Medication Sig Dispense Refill  . brimonidine-timolol (COMBIGAN) 0.2-0.5 % ophthalmic solution Place 1 drop into both eyes daily.     . pantoprazole (PROTONIX) 40 MG tablet     . diphenhydramine-acetaminophen (TYLENOL PM) 25-500 MG TABS Take 1 tablet by mouth at bedtime as needed (insomnia).    Marland Kitchen ibuprofen (ADVIL,MOTRIN) 600 MG tablet Take 600 mg by mouth every 6 (six) hours as needed (elbow swelling).    . lansoprazole (PREVACID) 30 MG capsule Take 30 mg by mouth daily.    . naproxen (NAPROSYN) 500 MG tablet Take 1 tablet (500 mg total) by mouth 2 (two) times daily. (Patient not taking: Reported on 10/17/2017) 30 tablet 0   No current facility-administered medications for this visit.     Review of Systems  Constitutional:  Constitutional negative. HENT: HENT negative.  Eyes: Eyes negative.  Cardiovascular: Positive for leg swelling.  GI: Gastrointestinal negative.  Musculoskeletal: Musculoskeletal negative.  Skin: Skin negative.  Neurological: Neurological negative. Hematologic: Hematologic/lymphatic negative.  Psychiatric: Psychiatric negative.        Objective:  Objective   Vitals:   10/17/17 1256 10/17/17 1258  BP: (!) 160/80 (!) 155/82  Pulse: (!) 45   Resp: 20   SpO2: 97%   Weight: 266 lb  1.6 oz (120.7 kg)   Height: 6\' 1"  (1.854 m)    Body mass index is 35.11 kg/m.  Physical Exam  Constitutional: He is oriented to person, place, and time. He appears well-developed.  HENT:  Head: Normocephalic.  Eyes: Pupils are equal, round, and reactive to light.  Neck: Normal range of motion. Neck supple.  Cardiovascular: Normal rate.  Pulses:      Carotid pulses are 2+ on the right side, and 2+ on the left side.      Radial pulses are 2+ on the right side, and 2+ on the left side.       Dorsalis pedis pulses are 2+ on the right side, and 2+ on the  left side.  Pulmonary/Chest: Effort normal.  Abdominal: Soft. He exhibits no mass.  Musculoskeletal: Normal range of motion.  Right lower extremity 15% greater in size than left  Neurological: He is alert and oriented to person, place, and time.  Skin: Skin is warm and dry.  Psychiatric: He has a normal mood and affect. His behavior is normal. Judgment and thought content normal.    Data: I have independently interpreted his lower extremity venous reflux study which demonstrates no identifiable saphenous vein in the thigh on the right.  Left side evident vein measures 0.44 cm of the thigh.  GSV in the mid thigh on the left reflux 2046 ms     Assessment/Plan:     77 year old male presents for evaluation of right lower extremity welling.  He has previous intervention on his greater saphenous vein with stab phlebectomy of varicosities.  With that conservative measures will need to be taken including elevation of his leg when recumbent as well as compression stockings and activity as tolerates.  He demonstrates good understanding and can follow-up on an as-needed basis peer     Waynetta Sandy MD [facility]

## 2017-10-24 DIAGNOSIS — R972 Elevated prostate specific antigen [PSA]: Secondary | ICD-10-CM | POA: Diagnosis not present

## 2017-10-31 DIAGNOSIS — R972 Elevated prostate specific antigen [PSA]: Secondary | ICD-10-CM | POA: Diagnosis not present

## 2017-11-14 ENCOUNTER — Encounter (HOSPITAL_COMMUNITY): Payer: Medicare Other

## 2017-11-14 ENCOUNTER — Encounter: Payer: Medicare Other | Admitting: Vascular Surgery

## 2017-12-19 DIAGNOSIS — R972 Elevated prostate specific antigen [PSA]: Secondary | ICD-10-CM | POA: Diagnosis not present

## 2017-12-19 DIAGNOSIS — N411 Chronic prostatitis: Secondary | ICD-10-CM | POA: Diagnosis not present

## 2018-01-01 DIAGNOSIS — N400 Enlarged prostate without lower urinary tract symptoms: Secondary | ICD-10-CM | POA: Diagnosis not present

## 2018-01-01 DIAGNOSIS — R972 Elevated prostate specific antigen [PSA]: Secondary | ICD-10-CM | POA: Diagnosis not present

## 2018-02-23 DIAGNOSIS — Z23 Encounter for immunization: Secondary | ICD-10-CM | POA: Diagnosis not present

## 2018-03-13 DIAGNOSIS — E7800 Pure hypercholesterolemia, unspecified: Secondary | ICD-10-CM | POA: Diagnosis not present

## 2018-03-13 DIAGNOSIS — R739 Hyperglycemia, unspecified: Secondary | ICD-10-CM | POA: Diagnosis not present

## 2018-03-13 DIAGNOSIS — E669 Obesity, unspecified: Secondary | ICD-10-CM | POA: Diagnosis not present

## 2018-03-13 DIAGNOSIS — Z1389 Encounter for screening for other disorder: Secondary | ICD-10-CM | POA: Diagnosis not present

## 2018-03-13 DIAGNOSIS — K219 Gastro-esophageal reflux disease without esophagitis: Secondary | ICD-10-CM | POA: Diagnosis not present

## 2018-03-13 DIAGNOSIS — Z0000 Encounter for general adult medical examination without abnormal findings: Secondary | ICD-10-CM | POA: Diagnosis not present

## 2018-03-13 DIAGNOSIS — K2270 Barrett's esophagus without dysplasia: Secondary | ICD-10-CM | POA: Diagnosis not present

## 2018-07-10 DIAGNOSIS — N400 Enlarged prostate without lower urinary tract symptoms: Secondary | ICD-10-CM | POA: Diagnosis not present

## 2018-07-10 DIAGNOSIS — R972 Elevated prostate specific antigen [PSA]: Secondary | ICD-10-CM | POA: Diagnosis not present

## 2018-07-24 DIAGNOSIS — E669 Obesity, unspecified: Secondary | ICD-10-CM | POA: Diagnosis not present

## 2018-07-24 DIAGNOSIS — R7301 Impaired fasting glucose: Secondary | ICD-10-CM | POA: Diagnosis not present

## 2018-08-11 DIAGNOSIS — R7301 Impaired fasting glucose: Secondary | ICD-10-CM | POA: Diagnosis not present

## 2018-10-20 DIAGNOSIS — Z23 Encounter for immunization: Secondary | ICD-10-CM | POA: Diagnosis not present

## 2019-02-27 ENCOUNTER — Ambulatory Visit: Payer: Medicare Other

## 2019-03-04 ENCOUNTER — Ambulatory Visit: Payer: Medicare Other | Attending: Internal Medicine

## 2019-03-04 DIAGNOSIS — Z23 Encounter for immunization: Secondary | ICD-10-CM

## 2019-03-04 NOTE — Progress Notes (Signed)
   Covid-19 Vaccination Clinic  Name:  EYUEL DACRES    MRN: [id number] DOB: 27-Sep-1940  03/04/2019  Mr. Speller was observed post Covid-19 immunization for 15 minutes without incidence. He was provided with Vaccine Information Sheet and instruction to access the V-Safe system.   Mr. Lansdale was instructed to call 911 with any severe reactions post vaccine: Marland Kitchen Difficulty breathing  . Swelling of your face and throat  . A fast heartbeat  . A bad rash all over your body  . Dizziness and weakness    Immunizations Administered    Name Date Dose VIS Date Route   Pfizer COVID-19 Vaccine 03/04/2019  9:15 AM 0.3 mL 01/08/2019 Intramuscular   Manufacturer: [place]   Lot: [id number]   [place]: [id number]

## 2019-03-20 ENCOUNTER — Ambulatory Visit: Payer: Medicare Other

## 2019-03-29 ENCOUNTER — Ambulatory Visit: Payer: Medicare Other | Attending: Internal Medicine

## 2019-03-29 ENCOUNTER — Other Ambulatory Visit: Payer: Self-pay

## 2019-03-29 DIAGNOSIS — Z23 Encounter for immunization: Secondary | ICD-10-CM

## 2019-03-29 NOTE — Progress Notes (Signed)
   Covid-19 Vaccination Clinic  Name:  ALVEN MCBETH    MRN: [id number] DOB: July 05, 1940  03/29/2019  Mr. Henrich was observed post Covid-19 immunization for 15 minutes without incidence. He was provided with Vaccine Information Sheet and instruction to access the V-Safe system.   Mr. Wolfrom was instructed to call 911 with any severe reactions post vaccine: Marland Kitchen Difficulty breathing  . Swelling of your face and throat  . A fast heartbeat  . A bad rash all over your body  . Dizziness and weakness    Immunizations Administered    Name Date Dose VIS Date Route   Pfizer COVID-19 Vaccine 03/29/2019  9:41 AM 0.3 mL 01/08/2019 Intramuscular   Manufacturer: [place]   Lot: [id number]   [place]: [id number]

## 2019-09-14 DIAGNOSIS — D045 Carcinoma in situ of skin of trunk: Secondary | ICD-10-CM | POA: Diagnosis not present

## 2019-09-14 DIAGNOSIS — L308 Other specified dermatitis: Secondary | ICD-10-CM | POA: Diagnosis not present

## 2019-10-11 DIAGNOSIS — Z23 Encounter for immunization: Secondary | ICD-10-CM | POA: Diagnosis not present

## 2019-10-26 DIAGNOSIS — Z23 Encounter for immunization: Secondary | ICD-10-CM | POA: Diagnosis not present

## 2019-11-30 DIAGNOSIS — M79671 Pain in right foot: Secondary | ICD-10-CM | POA: Diagnosis not present

## 2019-11-30 DIAGNOSIS — M25571 Pain in right ankle and joints of right foot: Secondary | ICD-10-CM | POA: Diagnosis not present

## 2019-11-30 DIAGNOSIS — M7989 Other specified soft tissue disorders: Secondary | ICD-10-CM | POA: Diagnosis not present

## 2019-11-30 DIAGNOSIS — M7731 Calcaneal spur, right foot: Secondary | ICD-10-CM | POA: Diagnosis not present

## 2019-11-30 DIAGNOSIS — M19071 Primary osteoarthritis, right ankle and foot: Secondary | ICD-10-CM | POA: Diagnosis not present

## 2020-01-19 ENCOUNTER — Other Ambulatory Visit: Payer: Self-pay | Admitting: Internal Medicine

## 2020-01-19 DIAGNOSIS — E669 Obesity, unspecified: Secondary | ICD-10-CM | POA: Diagnosis not present

## 2020-01-19 DIAGNOSIS — E7800 Pure hypercholesterolemia, unspecified: Secondary | ICD-10-CM

## 2020-01-19 DIAGNOSIS — K2270 Barrett's esophagus without dysplasia: Secondary | ICD-10-CM | POA: Diagnosis not present

## 2020-01-19 DIAGNOSIS — Z79899 Other long term (current) drug therapy: Secondary | ICD-10-CM | POA: Diagnosis not present

## 2020-01-19 DIAGNOSIS — Z1389 Encounter for screening for other disorder: Secondary | ICD-10-CM | POA: Diagnosis not present

## 2020-01-19 DIAGNOSIS — R7301 Impaired fasting glucose: Secondary | ICD-10-CM | POA: Diagnosis not present

## 2020-01-19 DIAGNOSIS — K219 Gastro-esophageal reflux disease without esophagitis: Secondary | ICD-10-CM | POA: Diagnosis not present

## 2020-01-19 DIAGNOSIS — Z5181 Encounter for therapeutic drug level monitoring: Secondary | ICD-10-CM | POA: Diagnosis not present

## 2020-01-19 DIAGNOSIS — Z0000 Encounter for general adult medical examination without abnormal findings: Secondary | ICD-10-CM | POA: Diagnosis not present

## 2020-01-19 DIAGNOSIS — R972 Elevated prostate specific antigen [PSA]: Secondary | ICD-10-CM | POA: Diagnosis not present

## 2020-02-01 DIAGNOSIS — R972 Elevated prostate specific antigen [PSA]: Secondary | ICD-10-CM | POA: Diagnosis not present

## 2020-02-02 ENCOUNTER — Other Ambulatory Visit: Payer: Self-pay | Admitting: Urology

## 2020-02-02 DIAGNOSIS — R972 Elevated prostate specific antigen [PSA]: Secondary | ICD-10-CM

## 2020-02-09 ENCOUNTER — Ambulatory Visit
Admission: RE | Admit: 2020-02-09 | Discharge: 2020-02-09 | Disposition: A | Discharge status: Home / Self Care | Payer: Self-pay | Source: Ambulatory Visit | Attending: Internal Medicine | Admitting: Internal Medicine

## 2020-02-09 DIAGNOSIS — E7800 Pure hypercholesterolemia, unspecified: Secondary | ICD-10-CM

## 2020-02-26 ENCOUNTER — Inpatient Hospital Stay: Admission: RE | Admit: 2020-02-26 | Payer: Medicare Other | Source: Ambulatory Visit

## 2020-03-02 ENCOUNTER — Encounter: Payer: Self-pay | Admitting: *Deleted

## 2020-03-02 ENCOUNTER — Other Ambulatory Visit: Payer: Self-pay

## 2020-03-02 ENCOUNTER — Encounter: Payer: Self-pay | Admitting: Cardiology

## 2020-03-02 ENCOUNTER — Ambulatory Visit (INDEPENDENT_AMBULATORY_CARE_PROVIDER_SITE_OTHER): Payer: Medicare Other | Admitting: Cardiology

## 2020-03-02 VITALS — BP 130/80 | HR 61 | Ht 73.0 in | Wt 265.0 lb

## 2020-03-02 DIAGNOSIS — I708 Atherosclerosis of other arteries: Secondary | ICD-10-CM

## 2020-03-02 DIAGNOSIS — R931 Abnormal findings on diagnostic imaging of heart and coronary circulation: Secondary | ICD-10-CM

## 2020-03-02 DIAGNOSIS — I2510 Atherosclerotic heart disease of native coronary artery without angina pectoris: Secondary | ICD-10-CM

## 2020-03-02 NOTE — Addendum Note (Signed)
Addended by: Shellia Cleverly on: 03/02/2020 02:58 PM   Modules accepted: Orders

## 2020-03-02 NOTE — Progress Notes (Signed)
Cardiology Office Note:    Date:  03/02/2020   ID:  Allen Cervantes, DOB February 04, 1940, MRN [id number]  PCP:  Lavone Orn, MD  [facility] Cardiologist:  Candee Furbish, MD  [facility] Electrophysiologist:  None   Referring MD: Lavone Orn, MD    History of Present Illness:    Allen Cervantes is a 80 y.o. male here for the evaluation of elevated coronary calcium score at the request of Dr. Lavone Orn.  Coronary calcium score was performed on 02/09/2020-coronary artery calcium score is 954 which is 74th percentile .  He is currently taking aspirin 81 and Crestor 10 mg.  Has a history of aortic atherosclerosis discovered on CT scan as well.  His LDL was 116 triglycerides 170 total cholesterol 196 HDL 51, ALT 24 hemoglobin 15.5 PSA elevated at 14.5  Prior smoker about 40 years ago. Quit cold [place].  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 Barrett's esophagus   . Dislocated finger   . Diverticulosis   . GERD (gastroesophageal reflux disease)   . Glaucoma    both eyes  . Impaired glucose tolerance   . Obesity     Past Surgical History:  Procedure Laterality Date  . APPENDECTOMY  age 80  . COLONOSCOPY WITH PROPOFOL N/A 10/27/2012   Procedure: COLONOSCOPY WITH PROPOFOL;  Surgeon: Garlan Fair, MD;  Location: [facility];  Service: Endoscopy;  Laterality: N/A;  . ESOPHAGOGASTRODUODENOSCOPY (EGD) WITH PROPOFOL N/A 10/27/2012   Procedure: ESOPHAGOGASTRODUODENOSCOPY (EGD) WITH PROPOFOL;  Surgeon: Garlan Fair, MD;  Location: [facility];  Service: Endoscopy;  Laterality: N/A;  . omentum lining of stomach  age 75    Current Medications: Current Meds  Medication Sig  . aspirin 81 MG chewable tablet Chew 81 mg by mouth daily.  . brimonidine-timolol (COMBIGAN) 0.2-0.5 % ophthalmic solution Place 1 drop into both eyes  daily.   . diphenhydramine-acetaminophen (TYLENOL PM) 25-500 MG TABS Take 1 tablet by mouth at bedtime as needed (insomnia).  Marland Kitchen ibuprofen (ADVIL,MOTRIN) 600 MG tablet Take 600 mg by mouth every 6 (six) hours as needed (elbow swelling).  . lansoprazole (PREVACID) 30 MG capsule Take 30 mg by mouth daily.  . pantoprazole (PROTONIX) 40 MG tablet   . rosuvastatin (CRESTOR) 10 MG tablet Take 10 mg by mouth daily.     Allergies:   Patient has no known allergies.   Social History   Socioeconomic History  . Marital status: Married    Spouse name: Not on file  . Number of children: Not on file  . Years of education: Not on file  . Highest education level: Not on file  Occupational History  . Not on file  Tobacco Use  . Smoking status: Former Smoker    Packs/day: 1.00    Years: 10.00    Pack years: 10.00    Types: Cigarettes  . Smokeless tobacco: Never Used  . Tobacco comment: quit smoking 40 yrs agio  Vaping Use  . Vaping Use: Never used  Substance and Sexual Activity  . Alcohol use: Yes    Comment: wine glass 1-2 per day  . Drug use: No  . Sexual activity: Not on file  Other Topics Concern  . Not on file  Social History Narrative  . Not on file   Social Determinants of Health  Financial Resource Strain: Not on file  Food Insecurity: Not on file  Transportation Needs: Not on file  Physical Activity: Not on file  Stress: Not on file  Social Connections: Not on file     Family History: Father with MI in his 74s  ROS:   Please see the history of present illness.    No fevers chills nausea vomiting all other systems reviewed and are negative.  EKGs/Labs/Other Studies Reviewed:    The following studies were reviewed today: Prior office notes reviewed.  EKG:  EKG is  ordered today.  The ekg ordered today demonstrates sinus rhythm 61 no other abnormalities  Recent Labs: No results found for requested labs within last 8760 hours.  Recent Lipid Panel No results found  for: CHOL, TRIG, HDL, CHOLHDL, VLDL, LDLCALC, LDLDIRECT   Risk Assessment/Calculations:      Physical Exam:    VS:  BP 130/80 (BP Location: Left Arm, Patient Position: Sitting, Cuff Size: Normal)   Pulse 61   Ht 6\' 1"  (1.854 m)   Wt 265 lb (120.2 kg)   SpO2 96%   BMI 34.96 kg/m     Wt Readings from Last 3 Encounters:  03/02/20 265 lb (120.2 kg)  10/17/17 266 lb 1.6 oz (120.7 kg)  08/17/15 257 lb (116.6 kg)     GEN:  Well nourished, well developed in no acute distress HEENT: Normal NECK: No JVD; No carotid bruits LYMPHATICS: No lymphadenopathy CARDIAC: RRR, no murmurs, rubs, gallops RESPIRATORY:  Clear to auscultation without rales, wheezing or rhonchi  ABDOMEN: Soft, non-tender, non-distended MUSCULOSKELETAL:  No edema; No deformity  SKIN: Warm and dry NEUROLOGIC:  Alert and oriented x 3 PSYCHIATRIC:  Normal affect   ASSESSMENT:    1. Elevated coronary artery calcium score   2. Atherosclerosis of other arteries    3. Coronary artery disease involving native coronary artery of native heart without angina pectoris    PLAN:    In order of problems listed above:  Coronary artery disease/coronary artery calcification -Calcium score of 954, 74th percentile. -Agree with Crestor, aspirin, goal LDL less than 70.  May need to increase Crestor to 20 mg high intensity dose to optimize medical management. -Given his elevated calcium score of greater than 400, we will proceed with a pharmacologic stress test to ensure that there is no evidence of high risk ischemia present. -Personally reviewed the images with him of his CT scan. -Continue with aspirin 81 mg.  We will follow-up with results of stress test. In the meantime, 1 year follow-up.    Shared Decision Making/Informed Consent The risks [chest pain, shortness of breath, cardiac arrhythmias, dizziness, blood pressure fluctuations, myocardial infarction, stroke/transient ischemic attack, nausea, vomiting, allergic  reaction, radiation exposure, metallic taste sensation and life-threatening complications (estimated to be 1 in 10,000)], benefits (risk stratification, diagnosing coronary artery disease, treatment guidance) and alternatives of a nuclear stress test were discussed in detail with Mr. Elvis Coil and he agrees to proceed.       Medication Adjustments/Labs and Tests Ordered: Current medicines are reviewed at length with the patient today.  Concerns regarding medicines are outlined above.  Orders Placed This Encounter  Procedures  . MYOCARDIAL PERFUSION IMAGING  . EKG 12-Lead   No orders of the defined types were placed in this encounter.   Patient Instructions  Medication Instructions:  The current medical regimen is effective;  continue present plan and medications.  *If you need a refill on your cardiac medications before your next appointment,  please call your pharmacy*  Testing/Procedures: Your physician has requested that you have a lexiscan myoview. For further information please visit [email or web address]. Please follow instruction sheet, as given.  Follow-Up: At [facility], you and your health needs are our priority.  As part of our continuing mission to provide you with exceptional heart care, we have created designated Provider Care Teams.  These Care Teams include your primary Cardiologist (physician) and Advanced Practice Providers (APPs -  Physician Assistants and Nurse Practitioners) who all work together to provide you with the care you need, when you need it.  We recommend signing up for the patient portal called "MyChart".  Sign up information is provided on this After Visit Summary.  MyChart is used to connect with patients for Virtual Visits (Telemedicine).  Patients are able to view lab/test results, encounter notes, upcoming appointments, etc.  Non-urgent messages can be sent to your provider as well.   To learn more about what you can do with MyChart, go to  [email or web address].    Your next appointment:   12 month(s)  The format for your next appointment:   In Person  Provider:   Candee Furbish, MD   Thank you for choosing [facility]!!         Signed, Candee Furbish, MD  03/02/2020 12:28 PM    [place]

## 2020-03-02 NOTE — Patient Instructions (Signed)
Medication Instructions:  The current medical regimen is effective;  continue present plan and medications.  *If you need a refill on your cardiac medications before your next appointment, please call your pharmacy*  Testing/Procedures: Your physician has requested that you have a lexiscan myoview. For further information please visit [email or web address]. Please follow instruction sheet, as given.  Follow-Up: At [facility], you and your health needs are our priority.  As part of our continuing mission to provide you with exceptional heart care, we have created designated Provider Care Teams.  These Care Teams include your primary Cardiologist (physician) and Advanced Practice Providers (APPs -  Physician Assistants and Nurse Practitioners) who all work together to provide you with the care you need, when you need it.  We recommend signing up for the patient portal called "MyChart".  Sign up information is provided on this After Visit Summary.  MyChart is used to connect with patients for Virtual Visits (Telemedicine).  Patients are able to view lab/test results, encounter notes, upcoming appointments, etc.  Non-urgent messages can be sent to your provider as well.   To learn more about what you can do with MyChart, go to [email or web address].    Your next appointment:   12 month(s)  The format for your next appointment:   In Person  Provider:   Mark Skains, MD   Thank you for choosing [facility]!!      

## 2020-03-09 ENCOUNTER — Telehealth (HOSPITAL_COMMUNITY): Payer: Self-pay | Admitting: *Deleted

## 2020-03-09 NOTE — Telephone Encounter (Signed)
Left message on voicemail in reference to upcoming appointment scheduled for 03/15/20. Phone number given for a call back so details instructions can be given.  Kirstie Peri

## 2020-03-10 ENCOUNTER — Telehealth (HOSPITAL_COMMUNITY): Payer: Self-pay

## 2020-03-10 NOTE — Telephone Encounter (Signed)
Patient given detailed instructions per Myocardial Perfusion Study Information Sheet for the test on 03/15/20 at 1030. Patient notified to arrive 15 minutes early and that it is imperative to arrive on time for appointment to keep from having the test rescheduled.  If you need to cancel or reschedule your appointment, please call the office within 24 hours of your appointment. . Patient verbalized understanding. TMY

## 2020-03-13 NOTE — Addendum Note (Signed)
Addended by: Jerline Pain on: 03/13/2020 04:30 PM   Modules accepted: Orders

## 2020-03-15 ENCOUNTER — Other Ambulatory Visit: Payer: Self-pay

## 2020-03-15 ENCOUNTER — Ambulatory Visit (HOSPITAL_COMMUNITY): Payer: Medicare Other | Attending: Cardiology

## 2020-03-15 DIAGNOSIS — R931 Abnormal findings on diagnostic imaging of heart and coronary circulation: Secondary | ICD-10-CM

## 2020-03-15 DIAGNOSIS — I708 Atherosclerosis of other arteries: Secondary | ICD-10-CM | POA: Insufficient documentation

## 2020-03-15 LAB — MYOCARDIAL PERFUSION IMAGING
LV dias vol: 85 mL (ref 62–150)
LV sys vol: 36 mL
Peak HR: 65 {beats}/min
Rest HR: 65 {beats}/min
SDS: 1
SRS: 0
SSS: 1
TID: 1.07

## 2020-03-15 MED ORDER — TECHNETIUM TC 99M TETROFOSMIN IV KIT
32.4000 | PACK | Freq: Once | INTRAVENOUS | Status: AC | PRN
  Administered 2020-03-15: 32.4 via INTRAVENOUS
  Filled 2020-03-15: qty 33

## 2020-03-15 MED ORDER — TECHNETIUM TC 99M TETROFOSMIN IV KIT
10.1000 | PACK | Freq: Once | INTRAVENOUS | Status: AC | PRN
  Administered 2020-03-15: 10.1 via INTRAVENOUS
  Filled 2020-03-15: qty 11

## 2020-03-15 MED ORDER — REGADENOSON 0.4 MG/5ML IV SOLN
0.4000 mg | Freq: Once | INTRAVENOUS | Status: AC
  Administered 2020-03-15: 0.4 mg via INTRAVENOUS

## 2020-03-20 ENCOUNTER — Ambulatory Visit
Admission: RE | Admit: 2020-03-20 | Discharge: 2020-03-20 | Disposition: A | Discharge status: Home / Self Care | Payer: Medicare Other | Source: Ambulatory Visit | Attending: Urology | Admitting: Urology

## 2020-03-20 ENCOUNTER — Other Ambulatory Visit: Payer: Self-pay

## 2020-03-20 DIAGNOSIS — R972 Elevated prostate specific antigen [PSA]: Secondary | ICD-10-CM | POA: Diagnosis not present

## 2020-03-20 DIAGNOSIS — N402 Nodular prostate without lower urinary tract symptoms: Secondary | ICD-10-CM | POA: Diagnosis not present

## 2020-03-20 MED ORDER — GADOBENATE DIMEGLUMINE 529 MG/ML IV SOLN
20.0000 mL | Freq: Once | INTRAVENOUS | Status: AC | PRN
  Administered 2020-03-20: 20 mL via INTRAVENOUS

## 2020-03-29 DIAGNOSIS — R350 Frequency of micturition: Secondary | ICD-10-CM | POA: Diagnosis not present

## 2020-03-29 DIAGNOSIS — N410 Acute prostatitis: Secondary | ICD-10-CM | POA: Diagnosis not present

## 2020-03-29 DIAGNOSIS — R972 Elevated prostate specific antigen [PSA]: Secondary | ICD-10-CM | POA: Diagnosis not present

## 2020-03-29 DIAGNOSIS — R300 Dysuria: Secondary | ICD-10-CM | POA: Diagnosis not present

## 2020-04-25 DIAGNOSIS — C61 Malignant neoplasm of prostate: Secondary | ICD-10-CM | POA: Diagnosis not present

## 2020-04-25 DIAGNOSIS — R972 Elevated prostate specific antigen [PSA]: Secondary | ICD-10-CM | POA: Diagnosis not present

## 2020-04-30 DIAGNOSIS — Z23 Encounter for immunization: Secondary | ICD-10-CM | POA: Diagnosis not present

## 2020-05-11 ENCOUNTER — Other Ambulatory Visit: Payer: Self-pay | Admitting: Internal Medicine

## 2020-05-11 ENCOUNTER — Ambulatory Visit
Admission: RE | Admit: 2020-05-11 | Discharge: 2020-05-11 | Disposition: A | Discharge status: Home / Self Care | Payer: Medicare Other | Source: Ambulatory Visit | Attending: Internal Medicine | Admitting: Internal Medicine

## 2020-05-11 DIAGNOSIS — M79672 Pain in left foot: Secondary | ICD-10-CM | POA: Diagnosis not present

## 2020-05-11 DIAGNOSIS — R600 Localized edema: Secondary | ICD-10-CM | POA: Diagnosis not present

## 2020-05-11 DIAGNOSIS — M7989 Other specified soft tissue disorders: Secondary | ICD-10-CM | POA: Diagnosis not present

## 2020-05-15 ENCOUNTER — Other Ambulatory Visit (HOSPITAL_COMMUNITY): Payer: Self-pay | Admitting: Urology

## 2020-05-15 ENCOUNTER — Other Ambulatory Visit: Payer: Self-pay | Admitting: Urology

## 2020-05-15 DIAGNOSIS — C61 Malignant neoplasm of prostate: Secondary | ICD-10-CM

## 2020-05-22 DIAGNOSIS — H401131 Primary open-angle glaucoma, bilateral, mild stage: Secondary | ICD-10-CM | POA: Diagnosis not present

## 2020-06-02 ENCOUNTER — Ambulatory Visit (HOSPITAL_COMMUNITY)
Admission: RE | Admit: 2020-06-02 | Discharge: 2020-06-02 | Disposition: A | Discharge status: Home / Self Care | Payer: Medicare Other | Source: Ambulatory Visit | Attending: Urology | Admitting: Urology

## 2020-06-02 ENCOUNTER — Other Ambulatory Visit: Payer: Self-pay

## 2020-06-02 DIAGNOSIS — M19041 Primary osteoarthritis, right hand: Secondary | ICD-10-CM | POA: Diagnosis not present

## 2020-06-02 DIAGNOSIS — M1711 Unilateral primary osteoarthritis, right knee: Secondary | ICD-10-CM | POA: Diagnosis not present

## 2020-06-02 DIAGNOSIS — K449 Diaphragmatic hernia without obstruction or gangrene: Secondary | ICD-10-CM | POA: Diagnosis not present

## 2020-06-02 DIAGNOSIS — M19011 Primary osteoarthritis, right shoulder: Secondary | ICD-10-CM | POA: Diagnosis not present

## 2020-06-02 DIAGNOSIS — K5750 Diverticulosis of both small and large intestine without perforation or abscess without bleeding: Secondary | ICD-10-CM | POA: Diagnosis not present

## 2020-06-02 DIAGNOSIS — C61 Malignant neoplasm of prostate: Secondary | ICD-10-CM | POA: Diagnosis not present

## 2020-06-02 DIAGNOSIS — M47817 Spondylosis without myelopathy or radiculopathy, lumbosacral region: Secondary | ICD-10-CM | POA: Diagnosis not present

## 2020-06-02 MED ORDER — TECHNETIUM TC 99M MEDRONATE IV KIT
22.0000 | PACK | Freq: Once | INTRAVENOUS | Status: AC
  Administered 2020-06-02: 22 via INTRAVENOUS

## 2020-06-19 DIAGNOSIS — C61 Malignant neoplasm of prostate: Secondary | ICD-10-CM | POA: Diagnosis not present

## 2020-06-22 ENCOUNTER — Emergency Department (HOSPITAL_COMMUNITY): Payer: Medicare Other

## 2020-06-22 ENCOUNTER — Other Ambulatory Visit: Payer: Self-pay

## 2020-06-22 ENCOUNTER — Emergency Department (HOSPITAL_COMMUNITY)
Admission: EM | Admit: 2020-06-22 | Discharge: 2020-06-22 | Disposition: A | Discharge status: Home / Self Care | Payer: Medicare Other | Attending: Emergency Medicine | Admitting: Emergency Medicine

## 2020-06-22 ENCOUNTER — Encounter (HOSPITAL_COMMUNITY): Payer: Self-pay | Admitting: Emergency Medicine

## 2020-06-22 DIAGNOSIS — Z7982 Long term (current) use of aspirin: Secondary | ICD-10-CM | POA: Insufficient documentation

## 2020-06-22 DIAGNOSIS — M79602 Pain in left arm: Secondary | ICD-10-CM | POA: Diagnosis not present

## 2020-06-22 DIAGNOSIS — Z87891 Personal history of nicotine dependence: Secondary | ICD-10-CM | POA: Insufficient documentation

## 2020-06-22 DIAGNOSIS — S59902A Unspecified injury of left elbow, initial encounter: Secondary | ICD-10-CM | POA: Diagnosis not present

## 2020-06-22 DIAGNOSIS — X500XXA Overexertion from strenuous movement or load, initial encounter: Secondary | ICD-10-CM | POA: Insufficient documentation

## 2020-06-22 DIAGNOSIS — Y93H2 Activity, gardening and landscaping: Secondary | ICD-10-CM | POA: Insufficient documentation

## 2020-06-22 NOTE — ED Notes (Signed)
An After Visit Summary was printed and given to the patient. Discharge instructions given and no further questions at this time.  

## 2020-06-22 NOTE — ED Provider Notes (Signed)
[facility] DEPT Provider Note   CSN: [id number] Arrival date & time: 06/22/20  1912     History Chief Complaint  Patient presents with  . Arm Injury    QUANTARIUS GENRICH is a 80 y.o. male presents with a chief complaint of left arm pain.  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  Patient reports feeling "muscles rip," in his left upper arm.  Patient reports pain immediately after this event.  Patient rates his pain 7/10 on pain scale.  Pain is worse with movement.  Pain is improved with rest.  Patient denies any numbness or weakness to his extremities.  Patient is right-hand dominant.  HPI     Past Medical History:  Diagnosis Date  . Barrett's esophagus   . Dislocated finger   . Diverticulosis   . GERD (gastroesophageal reflux disease)   . Glaucoma    both eyes  . Impaired glucose tolerance   . Obesity     Patient Active Problem List   Diagnosis Date Noted  . Olecranon bursitis 11/22/2013    Past Surgical History:  Procedure Laterality Date  . APPENDECTOMY  age 47  . COLONOSCOPY WITH PROPOFOL N/A 10/27/2012   Procedure: COLONOSCOPY WITH PROPOFOL;  Surgeon: Garlan Fair, MD;  Location: [facility];  Service: Endoscopy;  Laterality: N/A;  . ESOPHAGOGASTRODUODENOSCOPY (EGD) WITH PROPOFOL N/A 10/27/2012   Procedure: ESOPHAGOGASTRODUODENOSCOPY (EGD) WITH PROPOFOL;  Surgeon: Garlan Fair, MD;  Location: [facility];  Service: Endoscopy;  Laterality: N/A;  . omentum lining of stomach  age 32       No family history on file.  Social History   Tobacco Use  . Smoking status: Former Smoker    Packs/day: 1.00    Years: 10.00    Pack years: 10.00    Types: Cigarettes  . Smokeless tobacco: Never Used  . Tobacco comment: quit smoking 40 yrs agio  Vaping Use  . Vaping Use: Never used  Substance Use Topics  . Alcohol use: Yes    Comment: wine glass 1-2 per day  . Drug use: No    Home  Medications Prior to Admission medications   Medication Sig Start Date End Date Taking? Authorizing Provider  aspirin 81 MG chewable tablet Chew 81 mg by mouth daily. 02/11/20   [provider]  brimonidine-timolol (COMBIGAN) 0.2-0.5 % ophthalmic solution Place 1 drop into both eyes daily.     [provider]  diphenhydramine-acetaminophen (TYLENOL PM) 25-500 MG TABS Take 1 tablet by mouth at bedtime as needed (insomnia).    [provider]  ibuprofen (ADVIL,MOTRIN) 600 MG tablet Take 600 mg by mouth every 6 (six) hours as needed (elbow swelling).    [provider]  lansoprazole (PREVACID) 30 MG capsule Take 30 mg by mouth daily.    [provider]  pantoprazole (PROTONIX) 40 MG tablet  03/13/17   [provider]  rosuvastatin (CRESTOR) 10 MG tablet Take 10 mg by mouth daily. 02/11/20   [provider]    Allergies    Patient has no known allergies.  Review of Systems   Review of Systems  Musculoskeletal: Positive for arthralgias and myalgias. Negative for joint swelling.  Skin: Negative for color change, pallor and wound.  Neurological: Negative for weakness and numbness.    Physical Exam Updated Vital Signs BP (!) 169/96   Pulse 87   Temp 98.2 F (36.8 C) (Oral)   Resp 16  SpO2 95%   Physical Exam Vitals and nursing note reviewed.  Constitutional:      General: He is not in acute distress.    Appearance: He is not ill-appearing, toxic-appearing or diaphoretic.  HENT:     Head: Normocephalic.  Eyes:     General: No scleral icterus.       Right eye: No discharge.        Left eye: No discharge.  Cardiovascular:     Rate and Rhythm: Normal rate.  Pulmonary:     Effort: Pulmonary effort is normal.  Musculoskeletal:     Left shoulder: No swelling, deformity, effusion, laceration, tenderness or bony tenderness. Decreased range of motion (Secondary to complaints of left abdominal pain).     Left upper arm:  Swelling (distal) and tenderness (distal) present. No edema, deformity, lacerations or bony tenderness.     Left elbow: No swelling, deformity, effusion or lacerations. Decreased range of motion. No tenderness.     Left forearm: No swelling, edema, deformity, lacerations, tenderness or bony tenderness.     Left wrist: No swelling, deformity, effusion, lacerations, tenderness, bony tenderness or snuff box tenderness. Normal range of motion.     Left hand: No swelling, deformity, lacerations, tenderness or bony tenderness. Normal range of motion. Normal sensation. Normal capillary refill.     Comments: Patient has tenderness to distal aspect of left upper arm.  Patient has decreased range of motion to left elbow however is able to slowly extend elbow.  Patient has difficulty with flexion of left elbow.  No bulge or palpable muscle defect appreciated  Skin:    General: Skin is warm and dry.  Neurological:     General: No focal deficit present.     Mental Status: He is alert.  Psychiatric:        Behavior: Behavior is cooperative.     ED Results / Procedures / Treatments   Labs (all labs ordered are listed, but only abnormal results are displayed) Labs Reviewed - No data to display  EKG None  Radiology DG Elbow Complete Left  Result Date: 06/22/2020 CLINICAL DATA:  Fall gardening, injury. Patient reports he felt muscle rip. EXAM: LEFT ELBOW - COMPLETE 3+ VIEW COMPARISON:  None. FINDINGS: There is no evidence of fracture, dislocation, or joint effusion. There is no evidence of arthropathy or other focal bone abnormality. Soft tissue edema posteriorly. IMPRESSION: Soft tissue edema. No fracture or subluxation. Electronically Signed   By: Keith Rake M.D.   On: 06/22/2020 21:00    Procedures Procedures   Medications Ordered in ED Medications - No data to display  ED Course  I have reviewed the triage vital signs and the nursing notes.  Pertinent labs & imaging results that were  available during my care of the patient were reviewed by me and considered in my medical decision making (see chart for details).    MDM Rules/Calculators/A&P                          Alert 80 year old male no acute distress, on toxic appearing.  Patient presents with chief complaint of left arm pain.  Pain occurred after he attempted to lift a 8 foot tree.  Patient reports that he felt " muscles rip," and left arm.  Patient has tenderness to distal aspect of left upper arm.  Patient has decreased range of motion to left elbow however is able to slowly extend elbow.  Patient has difficulty with  flexion of left elbow.  No bulge or palpable muscle defect appreciated.  Will obtain x-ray imaging of left elbow. X-ray imaging shows no fracture or subluxation.  Soft tissue swelling noted posteriorly.  Suspect musculoskeletal injury versus possible bicep tendon rupture.  Will place patient in sling and have him follow-up with orthopedic specialist.  Discussed results, findings, treatment and follow up. Patient advised of return precautions. Patient verbalized understanding and agreed with plan.   Final Clinical Impression(s) / ED Diagnoses Final diagnoses:  Left arm pain    Rx / DC Orders ED Discharge Orders    None       Loni Beckwith, PA-C 06/23/20 0105    Fredia Sorrow, MD 06/24/20 [phone]

## 2020-06-22 NOTE — ED Triage Notes (Signed)
Patient reports injury to left arm after lifting a tree today. Reports "felt muscles rip."

## 2020-06-22 NOTE — Discharge Instructions (Signed)
You came to the emergency department today to be evaluated for your left arm pain.  Your x-ray showed no broken bones or dislocations.  X-ray did show that you had soft tissue swelling around her ankle.  Please elevate arm and use ice to help reduce swelling.  Please follow-up with orthopedic provider.  Please take Ibuprofen (Advil, motrin) and Tylenol (acetaminophen) to relieve your pain.    You may take up to 600 MG (3 pills) of normal strength ibuprofen every 8 hours as needed.   You make take tylenol, up to 1,000 mg (two extra strength pills) every 8 hours as needed.   It is safe to take ibuprofen and tylenol at the same time as they work differently.   Do not take more than 3,000 mg tylenol in a 24 hour period (not more than one dose every 8 hours.  Please check all medication labels as many medications such as pain and cold medications may contain tylenol.  Do not drink alcohol while taking these medications.  Do not take other NSAID'S while taking ibuprofen (such as aleve or naproxen).  Please take ibuprofen with food to decrease stomach upset.  Get help right away if: You have a new injury and your pain is worse or different. You feel numb or you have tingling in the painful area.

## 2020-06-23 DIAGNOSIS — S46212A Strain of muscle, fascia and tendon of other parts of biceps, left arm, initial encounter: Secondary | ICD-10-CM | POA: Diagnosis not present

## 2020-06-30 DIAGNOSIS — S46212A Strain of muscle, fascia and tendon of other parts of biceps, left arm, initial encounter: Secondary | ICD-10-CM | POA: Diagnosis not present

## 2020-07-03 ENCOUNTER — Ambulatory Visit
Admission: RE | Admit: 2020-07-03 | Discharge: 2020-07-03 | Disposition: A | Discharge status: Home / Self Care | Payer: Medicare Other | Source: Ambulatory Visit | Attending: Radiation Oncology | Admitting: Radiation Oncology

## 2020-07-03 ENCOUNTER — Other Ambulatory Visit: Payer: Self-pay

## 2020-07-03 ENCOUNTER — Encounter: Payer: Self-pay | Admitting: Radiation Oncology

## 2020-07-03 ENCOUNTER — Encounter: Payer: Self-pay | Admitting: Medical Oncology

## 2020-07-03 VITALS — BP 145/66 | HR 61 | Temp 97.0°F | Resp 18 | Ht 71.0 in | Wt 260.1 lb

## 2020-07-03 DIAGNOSIS — K219 Gastro-esophageal reflux disease without esophagitis: Secondary | ICD-10-CM | POA: Insufficient documentation

## 2020-07-03 DIAGNOSIS — C61 Malignant neoplasm of prostate: Secondary | ICD-10-CM | POA: Diagnosis not present

## 2020-07-03 DIAGNOSIS — Z87891 Personal history of nicotine dependence: Secondary | ICD-10-CM | POA: Insufficient documentation

## 2020-07-03 DIAGNOSIS — K5790 Diverticulosis of intestine, part unspecified, without perforation or abscess without bleeding: Secondary | ICD-10-CM | POA: Diagnosis not present

## 2020-07-03 DIAGNOSIS — K2270 Barrett's esophagus without dysplasia: Secondary | ICD-10-CM | POA: Diagnosis not present

## 2020-07-03 DIAGNOSIS — Z79899 Other long term (current) drug therapy: Secondary | ICD-10-CM | POA: Diagnosis not present

## 2020-07-03 DIAGNOSIS — Z809 Family history of malignant neoplasm, unspecified: Secondary | ICD-10-CM | POA: Insufficient documentation

## 2020-07-03 DIAGNOSIS — R972 Elevated prostate specific antigen [PSA]: Secondary | ICD-10-CM | POA: Diagnosis not present

## 2020-07-03 DIAGNOSIS — E669 Obesity, unspecified: Secondary | ICD-10-CM | POA: Diagnosis not present

## 2020-07-03 HISTORY — DX: Malignant neoplasm of prostate: C61

## 2020-07-03 NOTE — Progress Notes (Signed)
Introduced myself to patient as the prostate nurse navigator and discussed my role. No barriers to care identified at this time. He is most interested in ADT, brachytherapy to treat his cancer. I gave him my business card and asked him to call me with questions or concerns. He voiced understanding.

## 2020-07-03 NOTE — Progress Notes (Signed)
Radiation Oncology         [phone] ________________________________  Initial outpatient Consultation  Name: Allen Cervantes MRN: [id number]  Date: 07/03/2020  DOB: 1940/01/30  [name], Allen Reichmann, MD  Lucas Mallow, MD   REFERRING PHYSICIAN: Lucas Mallow, MD  DIAGNOSIS: 80 y.o. gentleman with stage T1c adenocarcinoma of the prostate with a Gleason's score of 4+5 and a PSA of 16    ICD-10-CM   1. Malignant neoplasm of prostate ([place])  Allen Cervantes is a 80 y.o. gentleman.  He was noted to have an elevated PSA of 5.8 in 2019 by his primary care physician, Dr. Lavone Orn.  Accordingly, he was referred for evaluation in urology by Dr. Gloriann Loan.  The patient proceeded to transrectal ultrasound with 12 biopsies of the prostate on 12/19/17. Biopsies were benign.  The PSA rose to 16.00 on 02/01/20  MRI Prostate was done on 03/20/20 revealing a 1.9 cm PI-RADS 5 nodule in the left anterior apical peripheral zone with demonstration of extracapsular extension.     The patient proceeded to transrectal ultrasound with 12 biopsies of the prostate on 04/25/20.  The prostate volume measured 30.33 cc.  Out of 13 core biopsies with 12 plus ROI, 6 were positive.  The maximum Gleason score was 4+5, and this was seen in the ROI region and the left base.   CT Pelvis and Bone Scan were done on 06/02/20 revealing no metastatic disease.  The patient reviewed the biopsy results with his urologist and he has kindly been referred today for discussion of potential radiation treatment options.  PREVIOUS RADIATION THERAPY: No  PAST MEDICAL HISTORY:  has a past medical history of Barrett's esophagus, Dislocated finger, Diverticulosis, GERD (gastroesophageal reflux disease), Glaucoma, Impaired glucose tolerance, Obesity, and Prostate cancer ([place]).    PAST SURGICAL HISTORY: Past Surgical History:  Procedure Laterality Date  . APPENDECTOMY  age 83  . COLONOSCOPY WITH  PROPOFOL N/A 10/27/2012   Procedure: COLONOSCOPY WITH PROPOFOL;  Surgeon: Garlan Fair, MD;  Location: [facility];  Service: Endoscopy;  Laterality: N/A;  . ESOPHAGOGASTRODUODENOSCOPY (EGD) WITH PROPOFOL N/A 10/27/2012   Procedure: ESOPHAGOGASTRODUODENOSCOPY (EGD) WITH PROPOFOL;  Surgeon: Garlan Fair, MD;  Location: [facility];  Service: Endoscopy;  Laterality: N/A;  . omentum lining of stomach  age 24    FAMILY HISTORY: family history includes Cancer in his sister.  SOCIAL HISTORY:  reports that he quit smoking about 52 years ago. His smoking use included cigarettes. He has a 10.00 pack-year smoking history. He has never used smokeless tobacco. He reports current alcohol use. He reports that he does not use drugs.  ALLERGIES: Patient has no known allergies.  MEDICATIONS:  Current Outpatient Medications  Medication Sig Dispense Refill  . aspirin 81 MG chewable tablet Chew 81 mg by mouth daily.    . brimonidine (ALPHAGAN) 0.2 % ophthalmic solution     . hydrochlorothiazide (HYDRODIURIL) 12.5 MG tablet Take 12.5 mg by mouth every morning.    Marland Kitchen ibuprofen (ADVIL,MOTRIN) 600 MG tablet Take 600 mg by mouth every 6 (six) hours as needed (elbow swelling).    . Multiple Vitamin (MULTIVITAMINS PO)     . pantoprazole (PROTONIX) 40 MG tablet     . rosuvastatin (CRESTOR) 10 MG tablet Take 10 mg by mouth daily.    . tamsulosin (FLOMAX) 0.4 MG CAPS capsule Take 0.4 mg by mouth daily.    . timolol (TIMOPTIC) 0.5 % ophthalmic  solution      No current facility-administered medications for this encounter.    REVIEW OF SYSTEMS:  A 15 point review of systems is documented in the electronic medical record. This was obtained by the nursing staff. However, I reviewed this with the patient to discuss relevant findings and make appropriate changes.  Pertinent items noted in HPI and remainder of comprehensive ROS otherwise negative..  The patient completed an IPSS and IIEF questionnaire.  His IPSS score  was 6 indicating mild urinary outflow obstructive symptoms.   PHYSICAL EXAM: This patient is in no acute distress.  He is alert and oriented.   height is 5\' 11"  (1.803 m) and weight is 260 lb 2 oz (118 kg). His temporal temperature is 97 F (36.1 C) (abnormal). His blood pressure is 145/66 (abnormal) and his pulse is 61. His respiration is 18 and oxygen saturation is 100%.  He exhibits no respiratory distress or labored breathing.  He appears neurologically intact.  His mood is pleasant.  His affect is appropriate.  Please note the digital rectal exam findings described above.  KPS = 100  100 - Normal; no complaints; no evidence of disease. 90   - Able to carry on normal activity; minor signs or symptoms of disease. 80   - Normal activity with effort; some signs or symptoms of disease. 6   - Cares for self; unable to carry on normal activity or to do active work. 60   - Requires occasional assistance, but is able to care for most of his personal needs. 50   - Requires considerable assistance and frequent medical care. 4   - Disabled; requires special care and assistance. 60   - Severely disabled; hospital admission is indicated although death not imminent. 43   - Very sick; hospital admission necessary; active supportive treatment necessary. 10   - Moribund; fatal processes progressing rapidly. 0     - Dead  Karnofsky DA, Abelmann WH, Craver LS and Burchenal JH [phone]) The use of the nitrogen mustards in the palliative treatment of carcinoma: with particular reference to bronchogenic carcinoma Cancer 1 [phone]   LABORATORY DATA:  No results found for: WBC, HGB, HCT, MCV, PLT No results found for: NA, K, CL, CO2 No results found for: ALT, AST, GGT, ALKPHOS, BILITOT   RADIOGRAPHY: DG Elbow Complete Left  Result Date: 06/22/2020 CLINICAL DATA:  Fall gardening, injury. Patient reports he felt muscle rip. EXAM: LEFT ELBOW - COMPLETE 3+ VIEW COMPARISON:  None. FINDINGS: There is no evidence of  fracture, dislocation, or joint effusion. There is no evidence of arthropathy or other focal bone abnormality. Soft tissue edema posteriorly. IMPRESSION: Soft tissue edema. No fracture or subluxation. Electronically Signed   By: Keith Rake M.D.   On: 06/22/2020 21:00      IMPRESSION: This gentleman is a 80 y.o. gentleman with stage T1c adenocarcinoma of the prostate with a Gleason's score of 4+5 and a PSA of 16.  His Gleason's Score puts him into the high risk group.  Accordingly he is eligible for a variety of potential treatment options including LT-ADT with 8 weeks of IMRT versus LT-ADT with seed implant boost and 5 weeks of IMRT.  PLAN:Today I reviewed the findings and workup thus far.  We discussed the natural history of prostate cancer.  We reviewed the the implications of T-stage, Gleason's Score, and PSA on decision-making and outcomes in prostate cancer.  We discussed radiation treatment in the management of prostate cancer with regard to  the logistics and delivery of external beam radiation treatment as well as the logistics and delivery of prostate brachytherapy.  We compared and contrasted each of these approaches and also compared these against prostatectomy.  The patient expressed interest in prostate brachytherapy boost followed by IMRT.    We also discussed 2 years of ADT in terms of risks benefits etc.  He remains undecided today but he is leaning toward ADT now, and prostate brachytherapy after his family trip in August, followed by IMRT.  I will share my findings with Dr. Gloriann Loan and move forward with scheduling the procedure in the near future.     I enjoyed meeting with him today, and will look forward to participating in the care of this very nice gentleman.  I personally spent 75 minutes in this encounter including chart review, reviewing radiological studies, meeting face-to-face with the patient, entering orders and completing documentation.     ------------------------------------------------  Sheral Apley. Tammi Klippel, M.D.

## 2020-07-03 NOTE — Progress Notes (Signed)
GU Location of Tumor / Histology: prostatic adenocarcinoma  If Prostate Cancer, Gleason Score is (4 + 5) and PSA is (16). Prostate volume: 30.33g  Allen Cervantes had an initial prostate biopsy in November 2019 which was benign. Unfortunately, patient's psa continued to rise and a biopsy was repeated 04/25/2020.  Biopsies of prostate (if applicable) revealed:   Past/Anticipated interventions by urology, if any: prostate biopsy, MRI, repeat prostate biopsy, CT abd/pelvis, referral to Dr. Tammi Klippel to discuss EBRT  *Patient reports discussing ADT with Dr. Gloriann Loan. Patient has not received [place] or [place]. Patient opted to wait until after seeing Dr. Tammi Klippel to decide on ADT.  Past/Anticipated interventions by medical oncology, if any: no  Weight changes, if any: denies  Bowel/Bladder complaints, if any: Reports pressure with urination. Denies dysuria, hematuria, urinary leakage or incontinence. Denies any bowel complaints.   Nausea/Vomiting, if any: Denies  Pain issues, if any:  Reports pain in left arm associated with torn bicep.  SAFETY ISSUES:  Prior radiation? denies  Pacemaker/ICD? denies  Possible current pregnancy? no, male patient.  Is the patient on methotrexate? no  Current Complaints / other details:  80 year old male. Married to Allen Cervantes. Resides in [place]. Sister - cancer of unknown type

## 2020-07-07 ENCOUNTER — Telehealth: Payer: Self-pay | Admitting: *Deleted

## 2020-07-07 NOTE — Telephone Encounter (Signed)
CALLED PATIENT TO UPDATE, SPOKE WITH PATIENT 

## 2020-07-20 DIAGNOSIS — N401 Enlarged prostate with lower urinary tract symptoms: Secondary | ICD-10-CM | POA: Diagnosis not present

## 2020-07-20 DIAGNOSIS — R350 Frequency of micturition: Secondary | ICD-10-CM | POA: Diagnosis not present

## 2020-07-20 DIAGNOSIS — C61 Malignant neoplasm of prostate: Secondary | ICD-10-CM | POA: Diagnosis not present

## 2020-07-24 DIAGNOSIS — M25522 Pain in left elbow: Secondary | ICD-10-CM | POA: Diagnosis not present

## 2020-07-28 ENCOUNTER — Telehealth: Payer: Self-pay | Admitting: *Deleted

## 2020-07-28 ENCOUNTER — Other Ambulatory Visit: Payer: Self-pay | Admitting: Urology

## 2020-07-28 DIAGNOSIS — C61 Malignant neoplasm of prostate: Secondary | ICD-10-CM

## 2020-07-28 NOTE — Telephone Encounter (Signed)
CALLED PATIENT TO INFORM OF PRE-SEED APPTS. FOR 08-24-20 AND HIS IMPLANT ON 10-16-20, SPOKE WITH PATIENT AND HE IS AWARE OF THESE APPTS.

## 2020-08-16 DIAGNOSIS — C61 Malignant neoplasm of prostate: Secondary | ICD-10-CM | POA: Diagnosis not present

## 2020-08-23 ENCOUNTER — Telehealth: Payer: Self-pay | Admitting: *Deleted

## 2020-08-23 DIAGNOSIS — C61 Malignant neoplasm of prostate: Secondary | ICD-10-CM | POA: Diagnosis not present

## 2020-08-23 NOTE — Telephone Encounter (Signed)
CALLED PATIENT TO REMIND OF PRE-SEED APPTS. FOR 08-24-20, LVM FOR A RETURN CALL

## 2020-08-24 ENCOUNTER — Ambulatory Visit
Admission: RE | Admit: 2020-08-24 | Discharge: 2020-08-24 | Disposition: A | Discharge status: Home / Self Care | Payer: Medicare Other | Source: Ambulatory Visit | Attending: Radiation Oncology | Admitting: Radiation Oncology

## 2020-08-24 ENCOUNTER — Ambulatory Visit
Admission: RE | Admit: 2020-08-24 | Discharge: 2020-08-24 | Disposition: A | Discharge status: Home / Self Care | Payer: Medicare Other | Source: Ambulatory Visit | Attending: Urology | Admitting: Urology

## 2020-08-24 ENCOUNTER — Ambulatory Visit (HOSPITAL_COMMUNITY)
Admission: RE | Admit: 2020-08-24 | Discharge: 2020-08-24 | Disposition: A | Discharge status: Home / Self Care | Payer: Medicare Other | Source: Ambulatory Visit | Attending: Urology | Admitting: Urology

## 2020-08-24 ENCOUNTER — Encounter (HOSPITAL_COMMUNITY)
Admission: RE | Admit: 2020-08-24 | Discharge: 2020-08-24 | Disposition: A | Discharge status: Home / Self Care | Payer: Medicare Other | Source: Ambulatory Visit | Attending: Urology | Admitting: Urology

## 2020-08-24 ENCOUNTER — Other Ambulatory Visit: Payer: Self-pay

## 2020-08-24 DIAGNOSIS — Z510 Encounter for antineoplastic radiation therapy: Secondary | ICD-10-CM | POA: Insufficient documentation

## 2020-08-24 DIAGNOSIS — C61 Malignant neoplasm of prostate: Secondary | ICD-10-CM | POA: Insufficient documentation

## 2020-08-24 DIAGNOSIS — Z01818 Encounter for other preprocedural examination: Secondary | ICD-10-CM | POA: Diagnosis not present

## 2020-08-24 NOTE — Progress Notes (Signed)
  Radiation Oncology         ([phone] ________________________________  Name: DEVEREAUX STALLS MRN: [id number]  Date: 08/24/2020  DOB: 1940/02/14  SIMULATION AND TREATMENT PLANNING NOTE PUBIC ARCH STUDY  [id number], [name], MD  Lucas Mallow, MD  DIAGNOSIS: 80 y.o. gentleman with stage T1c adenocarcinoma of the prostate with a Gleason's score of 4+5 and a PSA of 16  Oncology History   No history exists.      ICD-10-CM   1. Malignant neoplasm of prostate ([place])  C61       COMPLEX SIMULATION:  The patient presented today for evaluation for possible prostate seed implant. He was brought to the radiation planning suite and placed supine on the CT couch. A 3-dimensional image study set was obtained in upload to the planning computer. There, on each axial slice, I contoured the prostate gland. Then, using three-dimensional radiation planning tools I reconstructed the prostate in view of the structures from the transperineal needle pathway to assess for possible pubic arch interference. In doing so, I did not appreciate any pubic arch interference. Also, the patient's prostate volume was estimated based on the drawn structure. The volume was 31 cc.  Given the pubic arch appearance and prostate volume, patient remains a good candidate to proceed with prostate seed implant. Today, he freely provided informed written consent to proceed.    PLAN: The patient will undergo prostate seed implant.   ________________________________  Sheral Apley. Tammi Klippel, M.D.

## 2020-10-06 ENCOUNTER — Telehealth: Payer: Self-pay | Admitting: *Deleted

## 2020-10-06 NOTE — Telephone Encounter (Signed)
CALLED PATIENT TO REMIND OF LAB APPT. FOR 10-09-20, SPOKE WITH PATIENT AND HE IS AWARE OF THIS APPT.

## 2020-10-09 ENCOUNTER — Other Ambulatory Visit: Payer: Self-pay

## 2020-10-09 ENCOUNTER — Encounter (HOSPITAL_COMMUNITY)
Admission: RE | Admit: 2020-10-09 | Discharge: 2020-10-09 | Disposition: A | Discharge status: Home / Self Care | Payer: Medicare Other | Source: Ambulatory Visit | Attending: Urology | Admitting: Urology

## 2020-10-09 DIAGNOSIS — Z01812 Encounter for preprocedural laboratory examination: Secondary | ICD-10-CM | POA: Insufficient documentation

## 2020-10-09 LAB — COMPREHENSIVE METABOLIC PANEL
ALT: 26 U/L (ref 0–44)
AST: 24 U/L (ref 15–41)
Albumin: 4.2 g/dL (ref 3.5–5.0)
Alkaline Phosphatase: 52 U/L (ref 38–126)
Anion gap: 9 (ref 5–15)
BUN: 26 mg/dL — ABNORMAL HIGH (ref 8–23)
CO2: 28 mmol/L (ref 22–32)
Calcium: 9.6 mg/dL (ref 8.9–10.3)
Chloride: 99 mmol/L (ref 98–111)
Creatinine, Ser: 1.09 mg/dL (ref 0.61–1.24)
GFR, Estimated: >60 mL/min (ref >60)
Glucose, Bld: 117 mg/dL — ABNORMAL HIGH (ref 70–99)
Potassium: 4.3 mmol/L (ref 3.5–5.1)
Sodium: 136 mmol/L (ref 135–145)
Total Bilirubin: 0.7 mg/dL (ref 0.3–1.2)
Total Protein: 7.7 g/dL (ref 6.5–8.1)

## 2020-10-09 LAB — CBC
HCT: 41.9 % (ref 39.0–52.0)
Hemoglobin: 14.3 g/dL (ref 13.0–17.0)
MCH: 32.2 pg (ref 26.0–34.0)
MCHC: 34.1 g/dL (ref 30.0–36.0)
MCV: 94.4 fL (ref 80.0–100.0)
Platelets: 224 10*3/uL (ref 150–400)
RBC: 4.44 MIL/uL (ref 4.22–5.81)
RDW: 14 % (ref 11.5–15.5)
WBC: 9.2 10*3/uL (ref 4.0–10.5)
nRBC: 0 % (ref 0.0–0.2)

## 2020-10-09 LAB — PROTIME-INR
INR: 0.9 (ref 0.8–1.2)
Prothrombin Time: 12.5 seconds (ref 11.4–15.2)

## 2020-10-09 LAB — APTT: aPTT: 29 seconds (ref 24–36)

## 2020-10-10 ENCOUNTER — Encounter (HOSPITAL_BASED_OUTPATIENT_CLINIC_OR_DEPARTMENT_OTHER): Payer: Self-pay | Admitting: Urology

## 2020-10-10 ENCOUNTER — Other Ambulatory Visit: Payer: Self-pay

## 2020-10-10 DIAGNOSIS — Z23 Encounter for immunization: Secondary | ICD-10-CM | POA: Diagnosis not present

## 2020-10-10 DIAGNOSIS — C61 Malignant neoplasm of prostate: Secondary | ICD-10-CM | POA: Diagnosis not present

## 2020-10-10 NOTE — Progress Notes (Signed)
Spoke w/ via phone for pre-op interview--- pt Lab needs dos----  no             Lab results------ pt had lab work done 10-09-2020, CBC/ CMP/ PT/ PTT results in epic;  current ekg in epic / chart COVID test -----patient states asymptomatic no test needed Arrive at ------- 0530 on 10-16-2020 NPO after MN NO Solid Food.  Clear liquids from MN until--- 0430 Med rec completed Medications to take morning of surgery ----- crestor, eye drops as usual Diabetic medication ----- n/a Patient instructed no nail polish to be worn day of surgery Patient instructed to bring photo id and insurance card day of surgery Patient aware to have Driver (ride ) / caregiver for 24 hours after surgery --wife, carolyn Patient Special Instructions ----- will do one fleet enema morning of surgery Pre-Op special Istructions ----- n/a Patient verbalized understanding of instructions that were given at this phone interview. Patient denies shortness of breath, chest pain, fever, cough at this phone interview.

## 2020-10-13 ENCOUNTER — Telehealth: Payer: Self-pay | Admitting: *Deleted

## 2020-10-13 NOTE — Telephone Encounter (Signed)
Called patient to remind of procedure for 10-16-20, spoke with patient's wife- Allen Cervantes and she is aware of this procedure

## 2020-10-15 ENCOUNTER — Encounter (HOSPITAL_BASED_OUTPATIENT_CLINIC_OR_DEPARTMENT_OTHER): Payer: Self-pay | Admitting: Urology

## 2020-10-15 NOTE — Anesthesia Preprocedure Evaluation (Addendum)
Anesthesia Evaluation  Patient identified by MRN, date of birth, ID band Patient awake    Reviewed: Allergy & Precautions, NPO status , Patient's Chart, lab work & pertinent test results, reviewed documented beta blocker date and time   Airway Mallampati: III  TM Distance: >3 FB Neck ROM: Full    Dental  (+) Teeth Intact, Caps, Dental Advisory Given   Pulmonary former smoker,    Pulmonary exam normal breath sounds clear to auscultation       Cardiovascular + CAD  Normal cardiovascular exam Rhythm:Regular Rate:Normal  Nuclear stress test   03-15-2020, low risk, normal perfusion , normal lv function and wall motion ,nuclear ef 58%   Neuro/Psych Glaucoma ou negative psych ROS   GI/Hepatic Neg liver ROS, hiatal hernia, GERD  Medicated and Controlled,Hx/o Barrett's esophagus   Endo/Other  Obesity Hyperlipidemia  Renal/GU negative Renal ROS   Prostate Ca    Musculoskeletal negative musculoskeletal ROS (+)   Abdominal (+) + obese,   Peds  Hematology negative hematology ROS (+)   Anesthesia Other Findings   Reproductive/Obstetrics                           Anesthesia Physical Anesthesia Plan  ASA: 2  Anesthesia Plan: General   Post-op Pain Management:    Induction: Intravenous  PONV Risk Score and Plan: 3 and Treatment may vary due to age or medical condition, Ondansetron and Dexamethasone  Airway Management Planned: LMA  Additional Equipment:   Intra-op Plan:   Post-operative Plan: Extubation in OR  Informed Consent: I have reviewed the patients History and Physical, chart, labs and discussed the procedure including the risks, benefits and alternatives for the proposed anesthesia with the patient or authorized representative who has indicated his/her understanding and acceptance.     Dental advisory given  Plan Discussed with: CRNA and Anesthesiologist  Anesthesia Plan  Comments:        Anesthesia Quick Evaluation

## 2020-10-16 ENCOUNTER — Encounter (HOSPITAL_BASED_OUTPATIENT_CLINIC_OR_DEPARTMENT_OTHER)
Admission: RE | Disposition: A | Discharge status: Home / Self Care | Payer: Self-pay | Source: Home / Self Care | Attending: Urology

## 2020-10-16 ENCOUNTER — Ambulatory Visit (HOSPITAL_COMMUNITY): Payer: Medicare Other

## 2020-10-16 ENCOUNTER — Encounter (HOSPITAL_BASED_OUTPATIENT_CLINIC_OR_DEPARTMENT_OTHER): Payer: Self-pay | Admitting: Urology

## 2020-10-16 ENCOUNTER — Ambulatory Visit (HOSPITAL_BASED_OUTPATIENT_CLINIC_OR_DEPARTMENT_OTHER)
Admission: RE | Admit: 2020-10-16 | Discharge: 2020-10-16 | Disposition: A | Discharge status: Home / Self Care | Payer: Medicare Other | Attending: Urology | Admitting: Urology

## 2020-10-16 ENCOUNTER — Ambulatory Visit (HOSPITAL_BASED_OUTPATIENT_CLINIC_OR_DEPARTMENT_OTHER): Payer: Medicare Other | Admitting: Anesthesiology

## 2020-10-16 DIAGNOSIS — E785 Hyperlipidemia, unspecified: Secondary | ICD-10-CM | POA: Diagnosis not present

## 2020-10-16 DIAGNOSIS — K219 Gastro-esophageal reflux disease without esophagitis: Secondary | ICD-10-CM | POA: Diagnosis not present

## 2020-10-16 DIAGNOSIS — Z79899 Other long term (current) drug therapy: Secondary | ICD-10-CM | POA: Diagnosis not present

## 2020-10-16 DIAGNOSIS — C61 Malignant neoplasm of prostate: Secondary | ICD-10-CM | POA: Diagnosis not present

## 2020-10-16 DIAGNOSIS — Z87891 Personal history of nicotine dependence: Secondary | ICD-10-CM | POA: Insufficient documentation

## 2020-10-16 DIAGNOSIS — I2510 Atherosclerotic heart disease of native coronary artery without angina pectoris: Secondary | ICD-10-CM | POA: Diagnosis not present

## 2020-10-16 HISTORY — DX: Localized edema: R60.0

## 2020-10-16 HISTORY — DX: Asymptomatic varicose veins of unspecified lower extremity: I83.90

## 2020-10-16 HISTORY — PX: RADIOACTIVE SEED IMPLANT: SHX5150

## 2020-10-16 HISTORY — DX: Unspecified glaucoma: H40.9

## 2020-10-16 HISTORY — DX: Presence of spectacles and contact lenses: Z97.3

## 2020-10-16 HISTORY — PX: SPACE OAR INSTILLATION: SHX6769

## 2020-10-16 HISTORY — DX: Diaphragmatic hernia without obstruction or gangrene: K44.9

## 2020-10-16 HISTORY — DX: Atherosclerotic heart disease of native coronary artery without angina pectoris: I25.10

## 2020-10-16 HISTORY — DX: Personal history of peptic ulcer disease: Z87.11

## 2020-10-16 SURGERY — INSERTION, RADIATION SOURCE, PROSTATE
Anesthesia: General | Site: Prostate

## 2020-10-16 MED ORDER — FENTANYL CITRATE (PF) 100 MCG/2ML IJ SOLN
INTRAMUSCULAR | Status: DC | PRN
  Administered 2020-10-16: 25 ug via INTRAVENOUS
  Administered 2020-10-16: 50 ug via INTRAVENOUS
  Administered 2020-10-16: 25 ug via INTRAVENOUS

## 2020-10-16 MED ORDER — STERILE WATER FOR IRRIGATION IR SOLN
Status: DC | PRN
  Administered 2020-10-16: 3 mL

## 2020-10-16 MED ORDER — CIPROFLOXACIN IN D5W 400 MG/200ML IV SOLN
INTRAVENOUS | Status: AC
  Filled 2020-10-16: qty 200

## 2020-10-16 MED ORDER — WHITE PETROLATUM EX OINT
TOPICAL_OINTMENT | CUTANEOUS | Status: AC
  Filled 2020-10-16: qty 5

## 2020-10-16 MED ORDER — EPHEDRINE 5 MG/ML INJ
INTRAVENOUS | Status: AC
  Filled 2020-10-16: qty 5

## 2020-10-16 MED ORDER — FENTANYL CITRATE (PF) 100 MCG/2ML IJ SOLN: 25.0000 ug | INTRAMUSCULAR | Status: DC | PRN

## 2020-10-16 MED ORDER — PROPOFOL 10 MG/ML IV BOLUS
INTRAVENOUS | Status: DC | PRN
  Administered 2020-10-16: 150 mg via INTRAVENOUS

## 2020-10-16 MED ORDER — DEXAMETHASONE SODIUM PHOSPHATE 10 MG/ML IJ SOLN
INTRAMUSCULAR | Status: AC
  Filled 2020-10-16: qty 1

## 2020-10-16 MED ORDER — FLEET ENEMA 7-19 GM/118ML RE ENEM: 1.0000 | ENEMA | Freq: Once | RECTAL | Status: DC

## 2020-10-16 MED ORDER — FENTANYL CITRATE (PF) 100 MCG/2ML IJ SOLN
INTRAMUSCULAR | Status: AC
  Filled 2020-10-16: qty 2

## 2020-10-16 MED ORDER — SODIUM CHLORIDE FLUSH 0.9 % IV SOLN
INTRAVENOUS | Status: DC | PRN
  Administered 2020-10-16: 10 mL

## 2020-10-16 MED ORDER — EPHEDRINE SULFATE-NACL 50-0.9 MG/10ML-% IV SOSY
PREFILLED_SYRINGE | INTRAVENOUS | Status: DC | PRN
  Administered 2020-10-16: 15 mg via INTRAVENOUS

## 2020-10-16 MED ORDER — PROPOFOL 10 MG/ML IV BOLUS
INTRAVENOUS | Status: AC
  Filled 2020-10-16: qty 40

## 2020-10-16 MED ORDER — ONDANSETRON HCL 4 MG/2ML IJ SOLN
INTRAMUSCULAR | Status: AC
  Filled 2020-10-16: qty 2

## 2020-10-16 MED ORDER — SODIUM CHLORIDE 0.9 % IR SOLN
Status: DC | PRN
  Administered 2020-10-16: 1000 mL via INTRAVESICAL

## 2020-10-16 MED ORDER — ONDANSETRON HCL 4 MG/2ML IJ SOLN: 4.0000 mg | Freq: Once | INTRAMUSCULAR | Status: DC | PRN

## 2020-10-16 MED ORDER — OXYCODONE HCL 5 MG PO TABS: 5.0000 mg | ORAL_TABLET | Freq: Once | ORAL | Status: DC | PRN

## 2020-10-16 MED ORDER — LIDOCAINE HCL (PF) 2 % IJ SOLN
INTRAMUSCULAR | Status: AC
  Filled 2020-10-16: qty 5

## 2020-10-16 MED ORDER — LACTATED RINGERS IV SOLN: INTRAVENOUS | Status: DC

## 2020-10-16 MED ORDER — OXYCODONE HCL 5 MG/5ML PO SOLN: 5.0000 mg | Freq: Once | ORAL | Status: DC | PRN

## 2020-10-16 MED ORDER — DEXAMETHASONE SODIUM PHOSPHATE 10 MG/ML IJ SOLN
INTRAMUSCULAR | Status: DC | PRN
  Administered 2020-10-16: 5 mg via INTRAVENOUS

## 2020-10-16 MED ORDER — ONDANSETRON HCL 4 MG/2ML IJ SOLN
INTRAMUSCULAR | Status: DC | PRN
  Administered 2020-10-16: 4 mg via INTRAVENOUS

## 2020-10-16 MED ORDER — IOHEXOL 300 MG/ML  SOLN
INTRAMUSCULAR | Status: DC | PRN
  Administered 2020-10-16: 7 mL

## 2020-10-16 MED ORDER — LIDOCAINE 2% (20 MG/ML) 5 ML SYRINGE
INTRAMUSCULAR | Status: DC | PRN
  Administered 2020-10-16: 80 mg via INTRAVENOUS

## 2020-10-16 MED ORDER — HYDROCODONE-ACETAMINOPHEN 5-325 MG PO TABS: 1.0000 | ORAL_TABLET | ORAL | 0 refills | Status: DC | PRN

## 2020-10-16 MED ORDER — CIPROFLOXACIN IN D5W 400 MG/200ML IV SOLN
400.0000 mg | INTRAVENOUS | Status: AC
  Administered 2020-10-16: 400 mg via INTRAVENOUS

## 2020-10-16 SURGICAL SUPPLY — 36 items
BAG DRN RND TRDRP ANRFLXCHMBR (UROLOGICAL SUPPLIES) ×2
BAG URINE DRAIN 2000ML AR STRL (UROLOGICAL SUPPLIES) ×3 IMPLANT
BLADE CLIPPER SENSICLIP SURGIC (BLADE) ×3 IMPLANT
CATH FOLEY 2WAY SLVR  5CC 16FR (CATHETERS) ×3
CATH FOLEY 2WAY SLVR 5CC 16FR (CATHETERS) ×2 IMPLANT
CATH ROBINSON RED A/P 16FR (CATHETERS) IMPLANT
CATH ROBINSON RED A/P 20FR (CATHETERS) ×3 IMPLANT
CLOTH BEACON ORANGE TIMEOUT ST (SAFETY) ×3 IMPLANT
CNTNR URN SCR LID CUP LEK RST (MISCELLANEOUS) ×4 IMPLANT
CONT SPEC 4OZ STRL OR WHT (MISCELLANEOUS) ×6
COVER BACK TABLE 60X90IN (DRAPES) ×3 IMPLANT
COVER MAYO STAND STRL (DRAPES) ×3 IMPLANT
DRSG TEGADERM 4X4.75 (GAUZE/BANDAGES/DRESSINGS) ×3 IMPLANT
DRSG TEGADERM 8X12 (GAUZE/BANDAGES/DRESSINGS) ×6 IMPLANT
GAUZE SPONGE 4X4 12PLY STRL LF (GAUZE/BANDAGES/DRESSINGS) ×3 IMPLANT
GLOVE SURG ENC MOIS LTX SZ6.5 (GLOVE) ×3 IMPLANT
GLOVE SURG ENC MOIS LTX SZ7.5 (GLOVE) ×3 IMPLANT
GLOVE SURG ORTHO LTX SZ8.5 (GLOVE) ×3 IMPLANT
GLOVE SURG UNDER POLY LF SZ6.5 (GLOVE) IMPLANT
GOWN STRL REUS W/TWL LRG LVL3 (GOWN DISPOSABLE) ×3 IMPLANT
GOWN STRL REUS W/TWL XL LVL3 (GOWN DISPOSABLE) ×3 IMPLANT
HOLDER FOLEY CATH W/STRAP (MISCELLANEOUS) IMPLANT
I Seed AgX 100 ×192 IMPLANT
IMPL SPACEOAR VUE SYSTEM (Spacer) ×2 IMPLANT
IMPLANT SPACEOAR VUE SYSTEM (Spacer) ×3 IMPLANT
IV NS 1000ML (IV SOLUTION) ×3
IV NS 1000ML BAXH (IV SOLUTION) ×2 IMPLANT
KIT TURNOVER CYSTO (KITS) ×3 IMPLANT
MARKER SKIN DUAL TIP RULER LAB (MISCELLANEOUS) ×3 IMPLANT
PACK CYSTO (CUSTOM PROCEDURE TRAY) ×3 IMPLANT
SURGILUBE 2OZ TUBE FLIPTOP (MISCELLANEOUS) ×3 IMPLANT
SUT BONE WAX W31G (SUTURE) IMPLANT
SYR 10ML LL (SYRINGE) ×3 IMPLANT
TOWEL OR 17X26 10 PK STRL BLUE (TOWEL DISPOSABLE) ×3 IMPLANT
UNDERPAD 30X36 HEAVY ABSORB (UNDERPADS AND DIAPERS) ×6 IMPLANT
WATER STERILE IRR 500ML POUR (IV SOLUTION) ×3 IMPLANT

## 2020-10-16 NOTE — Interval H&P Note (Signed)
History and Physical Interval Note:  10/16/2020 7:28 AM  Allen Cervantes  has presented today for surgery, with the diagnosis of PROSTATE CANCER.  The various methods of treatment have been discussed with the patient and family. After consideration of risks, benefits and other options for treatment, the patient has consented to  Procedure(s): RADIOACTIVE SEED IMPLANT/BRACHYTHERAPY IMPLANT (N/A) SPACE OAR INSTILLATION (N/A) as a surgical intervention.  The patient's history has been reviewed, patient examined, no change in status, stable for surgery.  I have reviewed the patient's chart and labs.  Questions were answered to the patient's satisfaction.     Marton Redwood, III

## 2020-10-16 NOTE — Progress Notes (Signed)
  Radiation Oncology         [phone] ________________________________  Name: Allen Cervantes MRN: [id number]  Date: 10/16/2020  DOB: 21-May-1940       Prostate Seed Implant  [id number], [name], MD  No ref. provider found  DIAGNOSIS:  80 y.o. gentleman with stage T1c adenocarcinoma of the prostate with a Gleason's score of 4+5 and a PSA of 16  PROCEDURE: Insertion of radioactive I-125 seeds into the prostate gland.  RADIATION DOSE: 110 Gy, boost therapy.  TECHNIQUE: CONSTANT [name] was brought to the operating room with the urologist. He was placed in the dorsolithotomy position. He was catheterized and a rectal tube was inserted. The perineum was shaved, prepped and draped. The ultrasound probe was then introduced into the rectum to see the prostate gland.  TREATMENT DEVICE: A needle grid was attached to the ultrasound probe stand and anchor needles were placed.  3D PLANNING: The prostate was imaged in 3D using a sagittal sweep of the prostate probe. These images were transferred to the planning computer. There, the prostate, urethra and rectum were defined on each axial reconstructed image. Then, the software created an optimized 3D plan and a few seed positions were adjusted. The quality of the plan was reviewed using [facility] information for the target and the following two organs at risk:  Urethra and Rectum.  Then the accepted plan was printed and handed off to the radiation therapist.  Under my supervision, the custom loading of the seeds and spacers was carried out and loaded into sealed vicryl sleeves.  These pre-loaded needles were then placed into the needle holder.Marland Kitchen  PROSTATE VOLUME STUDY:  Using transrectal ultrasound the volume of the prostate was verified to be 33 cc.  SPECIAL TREATMENT PROCEDURE/SUPERVISION AND HANDLING: The pre-loaded needles were then delivered under sagittal guidance. A total of 19 needles were used to deposit 64 seeds in the prostate gland. The individual seed  activity was 0.311 mCi.  SpaceOAR:  Yes  COMPLEX SIMULATION: At the end of the procedure, an anterior radiograph of the pelvis was obtained to document seed positioning and count. Cystoscopy was performed to check the urethra and bladder.  MICRODOSIMETRY: At the end of the procedure, the patient was emitting 0.035 mR/hr at 1 meter. Accordingly, he was considered safe for hospital discharge.  PLAN: The patient will return to the radiation oncology clinic for post implant CT dosimetry in three weeks.   ________________________________  Sheral Apley Tammi Klippel, M.D.

## 2020-10-16 NOTE — Anesthesia Postprocedure Evaluation (Signed)
Anesthesia Post Note  Patient: Allen Cervantes  Procedure(s) Performed: RADIOACTIVE SEED IMPLANT/BRACHYTHERAPY IMPLANT (Prostate) SPACE OAR INSTILLATION (Perineum)     Patient location during evaluation: PACU Anesthesia Type: General Level of consciousness: awake and alert and oriented Pain management: pain level controlled Vital Signs Assessment: post-procedure vital signs reviewed and stable Respiratory status: spontaneous breathing, nonlabored ventilation and respiratory function stable Cardiovascular status: blood pressure returned to baseline and stable Postop Assessment: no apparent nausea or vomiting Anesthetic complications: no   No notable events documented.  Last Vitals:  Vitals:   10/16/20 0930 10/16/20 0937  BP: 129/73 107/71  Pulse: (!) 57 61  Resp: 13 10  Temp:    SpO2: 94% 94%    Last Pain:  Vitals:   10/16/20 0930  TempSrc:   PainSc: 3                  [name] A.

## 2020-10-16 NOTE — Transfer of Care (Signed)
Immediate Anesthesia Transfer of Care Note  Patient: Allen Cervantes  Procedure(s) Performed: Procedure(s) (LRB): RADIOACTIVE SEED IMPLANT/BRACHYTHERAPY IMPLANT (N/A) SPACE OAR INSTILLATION (N/A)  Patient Location: PACU  Anesthesia Type: General  Level of Consciousness: awake, oriented, sedated and patient cooperative  Airway & Oxygen Therapy: Patient Spontanous Breathing and Patient connected to face mask oxygen  Post-op Assessment: Report given to PACU RN and Post -op Vital signs reviewed and stable  Post vital signs: Reviewed and stable  Complications: No apparent anesthesia complications Last Vitals:  Vitals Value Taken Time  BP 127/77 10/16/20 0904  Temp 36.3 C 10/16/20 0904  Pulse 63 10/16/20 0910  Resp 10 10/16/20 0910  SpO2 94 % 10/16/20 0910  Vitals shown include unvalidated device data.  Last Pain:  Vitals:   10/16/20 0557  TempSrc: Oral  PainSc: 0-No pain      Patients Stated Pain Goal: 5 ([phone]  Complications: No notable events documented.

## 2020-10-16 NOTE — Anesthesia Procedure Notes (Signed)
Procedure Name: LMA Insertion Date/Time: 10/16/2020 7:46 AM Performed by: Suan Halter, CRNA Pre-anesthesia Checklist: Patient identified, Emergency Drugs available, Suction available and Patient being monitored Patient Re-evaluated:Patient Re-evaluated prior to induction Oxygen Delivery Method: Circle system utilized Preoxygenation: Pre-oxygenation with 100% oxygen Induction Type: IV induction Ventilation: Mask ventilation without difficulty LMA: LMA inserted LMA Size: 4.0 Number of attempts: 1 Airway Equipment and Method: Bite block Placement Confirmation: positive ETCO2 Tube secured with: Tape Dental Injury: Teeth and Oropharynx as per pre-operative assessment

## 2020-10-16 NOTE — Discharge Instructions (Addendum)
Antibiotics You may be given a prescription for an antibiotic to take when you arrive home. If so, be sure to take every tablet in the bottle, even if you are feeling better before the prescription is finished. If you begin itching, notice a rash or start to swell on your trunk, arms, legs and/or throat, immediately stop taking the antibiotic and call your Urologist. Diet Resume your usual diet when you return home. To keep your bowels moving easily and softly, drink prune, apple and cranberry juice at room temperature. You may also take a stool softener, such as Colace, which is available without prescription at local pharmacies. Daily activities No driving or heavy lifting for at least two days after the implant. No bike riding, horseback riding or riding lawn mowers for the first month after the implant. Any strenuous physical activity should be approved by your doctor before you resume it. Sexual relations You may resume sexual relations two weeks after the procedure. A condom should be used for the first two weeks. Your semen may be dark brown or black; this is normal and is related bleeding that may have occurred during the implant. Postoperative swelling Expect swelling and bruising of the scrotum and perineum (the area between the scrotum and anus). Both the swelling and the bruising should resolve in l or 2 weeks. Ice packs and over- the-counter medications such as Tylenol, Advil or Aleve may lessen your discomfort. Postoperative urination Most men experience burning on urination and/or urinary frequency. If this becomes bothersome, contact your Urologist.  Medication can be prescribed to relieve these problems.  It is normal to have some blood in your urine for a few days after the implant. Special instructions related to the seeds It is unlikely that you will pass an Iodine-125 seed in your urine. The seeds are silver in color and are about as large as a grain of rice. If you pass a seed,  do not handle it with your fingers. Use a spoon to place it in an envelope or jar in place this in base occluded area such as the garage or basement for return to the radiation clinic at your convenience.  Contact your doctor for Temperature greater than 101 F Increasing pain Inability to urinate Follow-up  You should have follow up with your urologist and radiation oncologist about 3 weeks after the procedure. General information regarding Iodine seeds Iodine-125 is a low energy radioactive material. It is not deeply penetrating and loses energy at short distances. Your prostate will absorb the radiation. Objects that are touched or used by the patient do not become radioactive. Body wastes (urine and stool) or body fluids (saliva, tears, semen or blood) are not radioactive. The Nuclear Regulatory Commission (NRC) has determined that no radiation precautions are needed for patients undergoing Iodine-125 seed implantation. The NRC states that such patients do not present a risk to the people around them, including young children and pregnant women. However, in keeping with the general principle that radiation exposure should be kept as low reasonably possible, we suggest the following: Children and pets should not sit on the patient's lap for the first two (2) weeks after the implant. Pregnant (or possibly pregnant) women should avoid prolonged, close contact with the patient for the first two (2) weeks after the implant. A distance of three (3) feet is acceptable. At a distance of three (3) feet, there is no limit to the length of time anyone can be with the patient.   Post Anesthesia   Home Care Instructions  Activity: Get plenty of rest for the remainder of the day. A responsible individual must stay with you for 24 hours following the procedure.  For the next 24 hours, DO NOT: -Drive a car -Operate machinery -Drink alcoholic beverages -Take any medication unless instructed by your  physician -Make any legal decisions or sign important papers.  Meals: Start with liquid foods such as gelatin or soup. Progress to regular foods as tolerated. Avoid greasy, spicy, heavy foods. If nausea and/or vomiting occur, drink only clear liquids until the nausea and/or vomiting subsides. Call your physician if vomiting continues.  Special Instructions/Symptoms: Your throat may feel dry or sore from the anesthesia or the breathing tube placed in your throat during surgery. If this causes discomfort, gargle with warm salt water. The discomfort should disappear within 24 hours.      

## 2020-10-16 NOTE — Op Note (Signed)
PATIENT:  Allen Cervantes  PRE-OPERATIVE DIAGNOSIS:  Adenocarcinoma of the prostate  POST-OPERATIVE DIAGNOSIS:  Same  PROCEDURE:  1. I-125 radioactive seed implantation 2. Cystoscopy  3. Placement of SpaceOAR  SURGEON:  Surgeon(s): Wendie Simmer, MD  Radiation oncologist: Dr. Tyler Pita  ANESTHESIA:  General  EBL:  Minimal  DRAINS: 60 French Foley catheter  INDICATION: Lattie Haw  Description of procedure: After informed consent the patient was brought to the major OR, placed on the table and administered general anesthesia. He was then moved to the modified lithotomy position with his perineum perpendicular to the floor. His perineum and genitalia were then sterilely prepped. An official timeout was then performed. A 16 French Foley catheter was then placed in the bladder and filled with dilute contrast, a rectal tube was placed in the rectum and the transrectal ultrasound probe was placed in the rectum and affixed to the stand. He was then sterilely draped.  Real time ultrasonography was used along with the seed planning software Oncentra Prostate. This was used to develop the seed plan including the number of needles as well as number of seeds required for complete and adequate coverage. Real-time ultrasonography was then used along with the previously developed plan and the Nucletron device to implant a total of 64 seeds using 19 needles. This proceeded without difficulty or complication.   I then proceeded with placement of SpaceOAR by introducing a needle with the bevel angled inferiorly approximately 2 cm superior to the anus. This was angled downward and under direct ultrasound was placed within the space between the prostatic capsule and rectum. This was confirmed with a small amount of sterile saline injected and this was performed under direct ultrasound. I then attached the SpaceOAR to the needle and injected this in the space between the prostate and rectum with good  placement noted.  A Foley catheter was then removed as well as the transrectal ultrasound probe and rectal probe. Flexible cystoscopy was then performed using the 17 French flexible scope which revealed a normal urethra throughout its length down to the sphincter which appeared intact. The prostatic urethra revealed mild bilobar hypertrophy but no evidence of obstruction, seeds, spacers or lesions. The bladder was then entered and fully and systematically inspected. The ureteral orifices were noted to be of normal configuration and position. The mucosa revealed no evidence of tumors. There were also no stones identified within the bladder. I noted no seeds or spacers on the floor of the bladder and retroflexion of the scope revealed no seeds protruding from the base of the prostate.  The cystoscope was then removed and the patient was awakened and taken to recovery room in stable and satisfactory condition. He tolerated procedure well and there were no intraoperative complications.

## 2020-10-16 NOTE — H&P (Signed)
CC/HPI: Pt presents today for pre-operative history and physical exam in anticipation of brachytherapy and space oar placement by Dr. Gloriann Loan on 10/16/20. He is doing well and is without complaint.   Pt denies F/C, HA, CP, SOB, N/V, diarrhea/constipation, back pain, flank pain, hematuria, and dysuria.     HX:   02/01/2020  Most recent PSA on 01/19/2020 was 14.58. Last PSA value was up to 5.8 back in 2019. As mentioned above, he had a negative prostate biopsy back in November of 2019. He has a little bit increase in frequency since I last saw him. Otherwise no voiding complaints. He drinks 3 cups of coffee in the morning.   03/29/2020  MRI revealed a PIRADS 5 lesion. over the past 2 weeks, he has had an increase in urinary frequency and urgency. He also has some pain and discomfort when he voids.   06/19/2020  Patient doing well since prostate biopsy. Biopsy revealed a 30 g prostate. Unfortunately, pathology results were positive for adenocarcinoma the prostate in 5/12 cores. All cores located on the left. Maximum Gleason score was 4+5. He underwent a CT scan that revealed no evidence of metastasis and a bone scan that also revealed no evidence of metastasis.   07/20/2020: Back today for f/u exam. He has since seen Dr Tammi Klippel for consult. Per his note, the patient was leaning toward brachytherapy with external beam radiation as well but wanted to delay the treatment until he returns from a family trip in August of this year. Back today for follow-up visit to initiate ADT with [employer]. He is already scheduled for a preprocedure appointment with radiation oncology in late August. Overall patient doing well. He continues tamsulosin with noted stability of baseline lower urinary tract symptoms. Denies significantly bothersome daytime voiding symptoms. No changes in force of stream, dysuria or gross hematuria since time of last office visit. He has nocturia 1, occasionally twice at night. No recent fevers or  chills, nausea/vomiting. Denies any unexplained weight loss, new unexplained bone pain, new unexplained fatigue.   08/23/2020  Patient received Mills Koller about 1 month ago. PSA is 4.56, testosterone is castrate. He returns for 3 month Eligard injection. He has elected for brachytherapy boost followed by external beam radiation. He is scheduled for this in September.     ALLERGIES: None   MEDICATIONS: Hydrochlorothiazide  Tamsulosin Hcl 0.4 mg capsule 1 capsule PO Daily  Pantoprazole Sodium  Rosuvastatin Calcium     GU PSH: Locm 300-399Mg /Ml Iodine,1Ml - 06/02/2020 Prostate Needle Biopsy - 04/25/2020, 2019       PSH Notes: retina surgery   NON-GU PSH: Appendectomy - 1952 Surgical Pathology, Gross And Microscopic Examination For Prostate Needle - 04/25/2020, 2019     GU PMH: Prostate Cancer - 08/23/2020, - 07/20/2020, - 06/19/2020 BPH w/LUTS - 07/20/2020, - 04/25/2020, - 02/01/2020 Elevated PSA - 06/02/2020, - 04/25/2020, - 03/29/2020, - 02/01/2020, - 2019, - 2019, - 2019, - 2019 Urinary Frequency - 04/25/2020, (Stable), - 03/29/2020, - 02/01/2020 Acute prostatitis - 03/29/2020, - 2019 Dysuria - 03/29/2020 BPH w/o LUTS - 2019 Acute Cystitis/UTI - 2019      PMH Notes: HLD   NON-GU PMH: GERD Glaucoma    FAMILY HISTORY: Cancer - Sister Heart Attack - Father   SOCIAL HISTORY: Marital Status: Married Preferred Language: English; Ethnicity: Not Hispanic Or Latino; Race: White Current Smoking Status: Patient does not smoke anymore. Has not smoked since 08/29/1967. Smoked 1 pack per day.   Tobacco Use Assessment Completed: Used Tobacco in last  30 days? Does not use smokeless tobacco. Drinks 2 drinks per day. Types of alcohol consumed: Wine.  Does not use drugs. Drinks 2 caffeinated drinks per day. Has not had a blood transfusion. 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     Notes: ETOH wine couple of glasses every other night    REVIEW OF SYSTEMS:    GU Review Male:   Patient reports get up at  night to urinate. Patient denies frequent urination, hard to postpone urination, burning/ pain with urination, leakage of urine, stream starts and stops, trouble starting your stream, have to strain to urinate , erection problems, and penile pain.  Gastrointestinal (Upper):   Patient denies nausea, vomiting, and indigestion/ heartburn.  Gastrointestinal (Lower):   Patient denies diarrhea and constipation.  Constitutional:   Patient denies fever, night sweats, weight loss, and fatigue.  Skin:   Patient denies skin rash/ lesion and itching.  Eyes:   Patient denies blurred vision and double vision.  Ears/ Nose/ Throat:   Patient denies sore throat and sinus problems.  Hematologic/Lymphatic:   Patient denies swollen glands and easy bruising.  Cardiovascular:   Patient denies leg swelling and chest pains.  Respiratory:   Patient denies cough and shortness of breath.  Endocrine:   Patient denies excessive thirst.  Musculoskeletal:   Patient denies back pain and joint pain.  Neurological:   Patient denies headaches and dizziness.  Psychologic:   Patient denies depression and anxiety.   VITAL SIGNS:      10/10/2020 03:12 PM  Weight 260 lb / 117.93 kg  Height 73.5 in / 186.69 cm  BP 130/81 mmHg  Pulse 76 /min  Temperature 97.6 F / 36.4 C  BMI 33.8 kg/m   MULTI-SYSTEM PHYSICAL EXAMINATION:    Constitutional: Well-nourished. No physical deformities. Normally developed. Good grooming.  Neck: Neck symmetrical, not swollen. Normal tracheal position.  Respiratory: Normal breath sounds. No labored breathing, no use of accessory muscles.   Cardiovascular: Regular rate and rhythm. No murmur, no gallop.   Lymphatic: No enlargement of neck, axillae, groin.  Skin: No paleness, no jaundice, no cyanosis. No lesion, no ulcer, no rash.  Neurologic / Psychiatric: Oriented to time, oriented to place, oriented to person. No depression, no anxiety, no agitation.  Gastrointestinal: No mass, no tenderness, no  rigidity, obese abdomen.   Eyes: Normal conjunctivae. Normal eyelids.  Ears, Nose, Mouth, and Throat: Left ear no scars, no lesions, no masses. Right ear no scars, no lesions, no masses. Nose no scars, no lesions, no masses. Normal hearing. Normal lips.  Musculoskeletal: Normal gait and station of head and neck.     Complexity of Data:  Records Review:   Previous Patient Records  Urine Test Review:   Urinalysis   10/10/20  Urinalysis  Urine Appearance Clear   Urine Color Yellow   Urine Glucose Neg mg/dL  Urine Bilirubin Neg mg/dL  Urine Ketones Neg mg/dL  Urine Specific Gravity 1.020   Urine Blood Neg ery/uL  Urine pH 5.5   Urine Protein Neg mg/dL  Urine Urobilinogen 0.2 mg/dL  Urine Nitrites Neg   Urine Leukocyte Esterase Neg leu/uL   PROCEDURES:          Urinalysis - 81003 Dipstick Dipstick Cont'd  Color: Yellow Bilirubin: Neg mg/dL  Appearance: Clear Ketones: Neg mg/dL  Specific Gravity: 1.020 Blood: Neg ery/uL  pH: 5.5 Protein: Neg mg/dL  Glucose: Neg mg/dL Urobilinogen: 0.2 mg/dL    Nitrites: Neg    Leukocyte Esterase: Neg leu/uL  ASSESSMENT:      ICD-10 Details  1 GU:   Prostate Cancer - C61    PLAN:           Schedule Return Visit/Planned Activity: Keep Scheduled Appointment - Schedule Surgery          Document Letter(s):  Created for Patient: Clinical Summary         Notes:   There are no changes in the patients history or physical exam since last evaluation by Dr. Gloriann Loan. Pt is scheduled to undergo brachytherapy and space oar on 10/16/20.   All pt's questions were answered to the best of my ability.          Next Appointment:      Next Appointment: 10/16/2020 07:30 AM    Appointment Type: Surgery     Location: [facility] [phone]    Provider: Link Snuffer, III, M.D.    Reason for Visit: OP NE SEEDS SPACE OAR      Signed by Mcarthur Rossetti, PA on 10/10/20 at 3:38 PM (EDT

## 2020-10-17 ENCOUNTER — Encounter (HOSPITAL_BASED_OUTPATIENT_CLINIC_OR_DEPARTMENT_OTHER): Payer: Self-pay | Admitting: Urology

## 2020-11-08 ENCOUNTER — Ambulatory Visit: Payer: Medicare Other | Admitting: Radiation Oncology

## 2020-11-15 ENCOUNTER — Telehealth: Payer: Self-pay | Admitting: *Deleted

## 2020-11-15 NOTE — Telephone Encounter (Signed)
CALLED PATIENT TO REMIND OF SIM APPT. ON 11-17-20- APPT. TIME- 7:30 AM, SPOKE WITH PATIENT AND HE IS AWARE OF THIS APPT.

## 2020-11-16 DIAGNOSIS — C61 Malignant neoplasm of prostate: Secondary | ICD-10-CM | POA: Diagnosis not present

## 2020-11-17 ENCOUNTER — Ambulatory Visit: Payer: Self-pay | Admitting: Urology

## 2020-11-17 ENCOUNTER — Other Ambulatory Visit: Payer: Self-pay

## 2020-11-17 ENCOUNTER — Ambulatory Visit
Admission: RE | Admit: 2020-11-17 | Discharge: 2020-11-17 | Disposition: A | Discharge status: Home / Self Care | Payer: Medicare Other | Source: Ambulatory Visit | Attending: Radiation Oncology | Admitting: Radiation Oncology

## 2020-11-17 DIAGNOSIS — Z510 Encounter for antineoplastic radiation therapy: Secondary | ICD-10-CM | POA: Insufficient documentation

## 2020-11-17 DIAGNOSIS — C61 Malignant neoplasm of prostate: Secondary | ICD-10-CM | POA: Diagnosis not present

## 2020-11-17 NOTE — Progress Notes (Signed)
  Radiation Oncology         [phone] ________________________________  Name: Allen Cervantes MRN: [id number]  Date: 11/17/2020  DOB: 1940-11-14  SIMULATION AND TREATMENT PLANNING NOTE    ICD-10-CM   1. Malignant neoplasm of prostate ([facility])  C61       DIAGNOSIS:  80 y.o. gentleman with stage T1c adenocarcinoma of the prostate with a Gleason's score of 4+5 and a PSA of 16  NARRATIVE:  The patient was brought to the [facility].  Identity was confirmed.  All relevant records and images related to the planned course of therapy were reviewed.  The patient freely provided informed written consent to proceed with treatment after reviewing the details related to the planned course of therapy. The consent form was witnessed and verified by the simulation staff.  Then, the patient was set-up in a stable reproducible supine position for radiation therapy.  A vacuum lock pillow device was custom fabricated to position his legs in a reproducible immobilized position.  Then, I performed a urethrogram under sterile conditions to identify the prostatic apex.  CT images were obtained.  Surface markings were placed.  The CT images were loaded into the planning software.  Then the prostate target and avoidance structures including the rectum, bladder, bowel and hips were contoured.  Treatment planning then occurred.  The radiation prescription was entered and confirmed.  A total of one complex treatment devices were fabricated. I have requested : Intensity Modulated Radiotherapy (IMRT) is medically necessary for this case for the following reason:  Rectal sparing.Marland Kitchen  PLAN:  The patient will receive 45 Gy in 25 fractions of 1.8 Gy, to supplement an up-front prostate seed implant boost of 110 Gy to achieve a total nominal dose of 155 Gy.  ________________________________  Sheral Apley Tammi Klippel, M.D.

## 2020-11-17 NOTE — Progress Notes (Signed)
  Radiation Oncology         [phone] ________________________________  Name: Allen Cervantes MRN: [id number]  Date: 11/17/2020  DOB: [birth year]  COMPLEX SIMULATION NOTE  NARRATIVE:  The patient was brought to the [facility] today following prostate seed implantation approximately one month ago.  Identity was confirmed.  All relevant records and images related to the planned course of therapy were reviewed.  Then, the patient was set-up supine.  CT images were obtained.  The CT images were loaded into the planning software.  Then the prostate and rectum were contoured.  Treatment planning then occurred.  The implanted iodine 125 seeds were identified by the physics staff for projection of radiation distribution  I have requested : 3D Simulation  I have requested a DVH of the following structures: Prostate and rectum.    ________________________________  Sheral Apley Tammi Klippel, M.D.

## 2020-11-20 DIAGNOSIS — C61 Malignant neoplasm of prostate: Secondary | ICD-10-CM | POA: Diagnosis not present

## 2020-11-27 DIAGNOSIS — Z5111 Encounter for antineoplastic chemotherapy: Secondary | ICD-10-CM | POA: Diagnosis not present

## 2020-11-27 DIAGNOSIS — C61 Malignant neoplasm of prostate: Secondary | ICD-10-CM | POA: Diagnosis not present

## 2020-11-27 DIAGNOSIS — Z510 Encounter for antineoplastic radiation therapy: Secondary | ICD-10-CM | POA: Diagnosis not present

## 2020-11-28 ENCOUNTER — Other Ambulatory Visit: Payer: Self-pay

## 2020-11-28 ENCOUNTER — Ambulatory Visit
Admission: RE | Admit: 2020-11-28 | Discharge: 2020-11-28 | Disposition: A | Discharge status: Home / Self Care | Payer: Medicare Other | Source: Ambulatory Visit | Attending: Radiation Oncology | Admitting: Radiation Oncology

## 2020-11-28 DIAGNOSIS — Z510 Encounter for antineoplastic radiation therapy: Secondary | ICD-10-CM | POA: Diagnosis not present

## 2020-11-28 DIAGNOSIS — C61 Malignant neoplasm of prostate: Secondary | ICD-10-CM | POA: Insufficient documentation

## 2020-11-29 ENCOUNTER — Encounter: Payer: Self-pay | Admitting: Radiation Oncology

## 2020-11-29 ENCOUNTER — Ambulatory Visit
Admission: RE | Admit: 2020-11-29 | Discharge: 2020-11-29 | Disposition: A | Discharge status: Home / Self Care | Payer: Medicare Other | Source: Ambulatory Visit | Attending: Radiation Oncology | Admitting: Radiation Oncology

## 2020-11-29 DIAGNOSIS — C61 Malignant neoplasm of prostate: Secondary | ICD-10-CM | POA: Diagnosis not present

## 2020-11-29 DIAGNOSIS — Z510 Encounter for antineoplastic radiation therapy: Secondary | ICD-10-CM | POA: Diagnosis not present

## 2020-11-30 ENCOUNTER — Other Ambulatory Visit: Payer: Self-pay

## 2020-11-30 ENCOUNTER — Ambulatory Visit
Admission: RE | Admit: 2020-11-30 | Discharge: 2020-11-30 | Disposition: A | Discharge status: Home / Self Care | Payer: Medicare Other | Source: Ambulatory Visit | Attending: Radiation Oncology | Admitting: Radiation Oncology

## 2020-11-30 DIAGNOSIS — Z510 Encounter for antineoplastic radiation therapy: Secondary | ICD-10-CM | POA: Diagnosis not present

## 2020-11-30 DIAGNOSIS — C61 Malignant neoplasm of prostate: Secondary | ICD-10-CM | POA: Diagnosis not present

## 2020-12-01 ENCOUNTER — Ambulatory Visit
Admission: RE | Admit: 2020-12-01 | Discharge: 2020-12-01 | Disposition: A | Discharge status: Home / Self Care | Payer: Medicare Other | Source: Ambulatory Visit | Attending: Radiation Oncology | Admitting: Radiation Oncology

## 2020-12-01 ENCOUNTER — Other Ambulatory Visit: Payer: Self-pay

## 2020-12-01 DIAGNOSIS — Z510 Encounter for antineoplastic radiation therapy: Secondary | ICD-10-CM | POA: Diagnosis not present

## 2020-12-01 DIAGNOSIS — C61 Malignant neoplasm of prostate: Secondary | ICD-10-CM | POA: Diagnosis not present

## 2020-12-01 NOTE — Progress Notes (Signed)
Pt here for patient teaching. Pt given Radiation and You booklet and skin care instructions.  Reviewed areas of pertinence such as diarrhea, fatigue, hair loss, nausea and vomiting, sexual and fertility changes, skin changes, and urinary and bladder changes. Pt able to give teach back of to pat skin, use unscented/gentle soap, use baby wipes, have Imodium on hand, drink plenty of water, and sitz bath, avoid applying anything to skin within 4 hours of treatment and to use an electric razor if they must shave. Pt verbalizes understanding of information given and will contact nursing with any questions or concerns.     Http://rtanswers.org/treatmentinformation/whattoexpect/index      

## 2020-12-04 ENCOUNTER — Ambulatory Visit
Admission: RE | Admit: 2020-12-04 | Discharge: 2020-12-04 | Disposition: A | Discharge status: Home / Self Care | Payer: Medicare Other | Source: Ambulatory Visit | Attending: Radiation Oncology | Admitting: Radiation Oncology

## 2020-12-04 DIAGNOSIS — C61 Malignant neoplasm of prostate: Secondary | ICD-10-CM | POA: Diagnosis not present

## 2020-12-04 DIAGNOSIS — Z510 Encounter for antineoplastic radiation therapy: Secondary | ICD-10-CM | POA: Diagnosis not present

## 2020-12-05 ENCOUNTER — Ambulatory Visit
Admission: RE | Admit: 2020-12-05 | Discharge: 2020-12-05 | Disposition: A | Discharge status: Home / Self Care | Payer: Medicare Other | Source: Ambulatory Visit | Attending: Radiation Oncology | Admitting: Radiation Oncology

## 2020-12-05 ENCOUNTER — Other Ambulatory Visit: Payer: Self-pay

## 2020-12-05 DIAGNOSIS — Z510 Encounter for antineoplastic radiation therapy: Secondary | ICD-10-CM | POA: Diagnosis not present

## 2020-12-05 DIAGNOSIS — C61 Malignant neoplasm of prostate: Secondary | ICD-10-CM | POA: Diagnosis not present

## 2020-12-06 ENCOUNTER — Ambulatory Visit
Admission: RE | Admit: 2020-12-06 | Discharge: 2020-12-06 | Disposition: A | Discharge status: Home / Self Care | Payer: Medicare Other | Source: Ambulatory Visit | Attending: Radiation Oncology | Admitting: Radiation Oncology

## 2020-12-06 DIAGNOSIS — Z510 Encounter for antineoplastic radiation therapy: Secondary | ICD-10-CM | POA: Diagnosis not present

## 2020-12-06 DIAGNOSIS — C61 Malignant neoplasm of prostate: Secondary | ICD-10-CM | POA: Diagnosis not present

## 2020-12-07 ENCOUNTER — Ambulatory Visit
Admission: RE | Admit: 2020-12-07 | Discharge: 2020-12-07 | Disposition: A | Discharge status: Home / Self Care | Payer: Medicare Other | Source: Ambulatory Visit | Attending: Radiation Oncology | Admitting: Radiation Oncology

## 2020-12-07 DIAGNOSIS — Z510 Encounter for antineoplastic radiation therapy: Secondary | ICD-10-CM | POA: Diagnosis not present

## 2020-12-07 DIAGNOSIS — C61 Malignant neoplasm of prostate: Secondary | ICD-10-CM | POA: Diagnosis not present

## 2020-12-08 ENCOUNTER — Ambulatory Visit
Admission: RE | Admit: 2020-12-08 | Discharge: 2020-12-08 | Disposition: A | Discharge status: Home / Self Care | Payer: Medicare Other | Source: Ambulatory Visit | Attending: Radiation Oncology | Admitting: Radiation Oncology

## 2020-12-08 DIAGNOSIS — C61 Malignant neoplasm of prostate: Secondary | ICD-10-CM | POA: Diagnosis not present

## 2020-12-08 DIAGNOSIS — Z510 Encounter for antineoplastic radiation therapy: Secondary | ICD-10-CM | POA: Diagnosis not present

## 2020-12-11 ENCOUNTER — Ambulatory Visit
Admission: RE | Admit: 2020-12-11 | Discharge: 2020-12-11 | Disposition: A | Discharge status: Home / Self Care | Payer: Medicare Other | Source: Ambulatory Visit | Attending: Radiation Oncology | Admitting: Radiation Oncology

## 2020-12-11 DIAGNOSIS — Z510 Encounter for antineoplastic radiation therapy: Secondary | ICD-10-CM | POA: Diagnosis not present

## 2020-12-11 DIAGNOSIS — C61 Malignant neoplasm of prostate: Secondary | ICD-10-CM | POA: Diagnosis not present

## 2020-12-12 ENCOUNTER — Ambulatory Visit
Admission: RE | Admit: 2020-12-12 | Discharge: 2020-12-12 | Disposition: A | Discharge status: Home / Self Care | Payer: Medicare Other | Source: Ambulatory Visit | Attending: Radiation Oncology | Admitting: Radiation Oncology

## 2020-12-12 ENCOUNTER — Other Ambulatory Visit: Payer: Self-pay

## 2020-12-12 DIAGNOSIS — Z510 Encounter for antineoplastic radiation therapy: Secondary | ICD-10-CM | POA: Diagnosis not present

## 2020-12-12 DIAGNOSIS — C61 Malignant neoplasm of prostate: Secondary | ICD-10-CM | POA: Diagnosis not present

## 2020-12-13 ENCOUNTER — Ambulatory Visit
Admission: RE | Admit: 2020-12-13 | Discharge: 2020-12-13 | Disposition: A | Discharge status: Home / Self Care | Payer: Medicare Other | Source: Ambulatory Visit | Attending: Radiation Oncology | Admitting: Radiation Oncology

## 2020-12-13 ENCOUNTER — Other Ambulatory Visit: Payer: Self-pay

## 2020-12-13 DIAGNOSIS — Z510 Encounter for antineoplastic radiation therapy: Secondary | ICD-10-CM | POA: Diagnosis not present

## 2020-12-13 DIAGNOSIS — C61 Malignant neoplasm of prostate: Secondary | ICD-10-CM | POA: Diagnosis not present

## 2020-12-14 ENCOUNTER — Ambulatory Visit
Admission: RE | Admit: 2020-12-14 | Discharge: 2020-12-14 | Disposition: A | Discharge status: Home / Self Care | Payer: Medicare Other | Source: Ambulatory Visit | Attending: Radiation Oncology | Admitting: Radiation Oncology

## 2020-12-14 DIAGNOSIS — C61 Malignant neoplasm of prostate: Secondary | ICD-10-CM | POA: Diagnosis not present

## 2020-12-14 DIAGNOSIS — Z510 Encounter for antineoplastic radiation therapy: Secondary | ICD-10-CM | POA: Diagnosis not present

## 2020-12-15 ENCOUNTER — Other Ambulatory Visit: Payer: Self-pay

## 2020-12-15 ENCOUNTER — Ambulatory Visit
Admission: RE | Admit: 2020-12-15 | Discharge: 2020-12-15 | Disposition: A | Discharge status: Home / Self Care | Payer: Medicare Other | Source: Ambulatory Visit | Attending: Radiation Oncology | Admitting: Radiation Oncology

## 2020-12-15 DIAGNOSIS — C61 Malignant neoplasm of prostate: Secondary | ICD-10-CM | POA: Diagnosis not present

## 2020-12-15 DIAGNOSIS — Z510 Encounter for antineoplastic radiation therapy: Secondary | ICD-10-CM | POA: Diagnosis not present

## 2020-12-17 ENCOUNTER — Ambulatory Visit
Admission: RE | Admit: 2020-12-17 | Discharge: 2020-12-17 | Disposition: A | Discharge status: Home / Self Care | Payer: Medicare Other | Source: Ambulatory Visit | Attending: Radiation Oncology | Admitting: Radiation Oncology

## 2020-12-17 DIAGNOSIS — C61 Malignant neoplasm of prostate: Secondary | ICD-10-CM | POA: Diagnosis not present

## 2020-12-17 DIAGNOSIS — Z510 Encounter for antineoplastic radiation therapy: Secondary | ICD-10-CM | POA: Diagnosis not present

## 2020-12-18 ENCOUNTER — Other Ambulatory Visit: Payer: Self-pay

## 2020-12-18 ENCOUNTER — Ambulatory Visit
Admission: RE | Admit: 2020-12-18 | Discharge: 2020-12-18 | Disposition: A | Discharge status: Home / Self Care | Payer: Medicare Other | Source: Ambulatory Visit | Attending: Radiation Oncology | Admitting: Radiation Oncology

## 2020-12-18 DIAGNOSIS — C61 Malignant neoplasm of prostate: Secondary | ICD-10-CM | POA: Diagnosis not present

## 2020-12-18 DIAGNOSIS — Z510 Encounter for antineoplastic radiation therapy: Secondary | ICD-10-CM | POA: Diagnosis not present

## 2020-12-19 ENCOUNTER — Ambulatory Visit
Admission: RE | Admit: 2020-12-19 | Discharge: 2020-12-19 | Disposition: A | Discharge status: Home / Self Care | Payer: Medicare Other | Source: Ambulatory Visit | Attending: Radiation Oncology | Admitting: Radiation Oncology

## 2020-12-19 DIAGNOSIS — Z510 Encounter for antineoplastic radiation therapy: Secondary | ICD-10-CM | POA: Diagnosis not present

## 2020-12-19 DIAGNOSIS — C61 Malignant neoplasm of prostate: Secondary | ICD-10-CM | POA: Diagnosis not present

## 2020-12-20 ENCOUNTER — Other Ambulatory Visit: Payer: Self-pay

## 2020-12-20 ENCOUNTER — Ambulatory Visit
Admission: RE | Admit: 2020-12-20 | Discharge: 2020-12-20 | Disposition: A | Discharge status: Home / Self Care | Payer: Medicare Other | Source: Ambulatory Visit | Attending: Radiation Oncology | Admitting: Radiation Oncology

## 2020-12-20 DIAGNOSIS — C61 Malignant neoplasm of prostate: Secondary | ICD-10-CM | POA: Diagnosis not present

## 2020-12-20 DIAGNOSIS — Z510 Encounter for antineoplastic radiation therapy: Secondary | ICD-10-CM | POA: Diagnosis not present

## 2020-12-24 ENCOUNTER — Ambulatory Visit: Payer: Medicare Other

## 2020-12-24 NOTE — Progress Notes (Signed)
  Radiation Oncology         [phone] ________________________________  Name: Allen Cervantes MRN: [id number]  Date: 11/29/2020  DOB: [birth year]  3D Planning Note   Prostate Brachytherapy Post-Implant Dosimetry  Diagnosis: 80 y.o. gentleman with stage T1c adenocarcinoma of the prostate with a Gleason's score of 4+5 and a PSA of 16  Narrative: On a previous date, Allen Cervantes returned following prostate seed implantation for post implant planning. He underwent CT scan complex simulation to delineate the three-dimensional structures of the pelvis and demonstrate the radiation distribution.  Since that time, the seed localization, and complex isodose planning with dose volume histograms have now been completed.  Results:   Prostate Coverage - The dose of radiation delivered to the 90% or more of the prostate gland (D90) was 101.32% of the prescription dose. This exceeds our goal of greater than 90%. Rectal Sparing - The volume of rectal tissue receiving the prescription dose or higher was 0.0 cc. This falls under our thresholds tolerance of 1.0 cc.  Impression: The prostate seed implant appears to show adequate target coverage and appropriate rectal sparing.  Plan:  The patient will continue to follow with urology for ongoing PSA determinations. I would anticipate a high likelihood for local tumor control with minimal risk for rectal morbidity.  ________________________________  Sheral Apley Tammi Klippel, M.D.

## 2020-12-25 ENCOUNTER — Other Ambulatory Visit: Payer: Self-pay

## 2020-12-25 ENCOUNTER — Ambulatory Visit
Admission: RE | Admit: 2020-12-25 | Discharge: 2020-12-25 | Disposition: A | Discharge status: Home / Self Care | Payer: Medicare Other | Source: Ambulatory Visit | Attending: Radiation Oncology | Admitting: Radiation Oncology

## 2020-12-25 DIAGNOSIS — C61 Malignant neoplasm of prostate: Secondary | ICD-10-CM | POA: Diagnosis not present

## 2020-12-25 DIAGNOSIS — Z510 Encounter for antineoplastic radiation therapy: Secondary | ICD-10-CM | POA: Diagnosis not present

## 2020-12-26 ENCOUNTER — Ambulatory Visit
Admission: RE | Admit: 2020-12-26 | Discharge: 2020-12-26 | Disposition: A | Discharge status: Home / Self Care | Payer: Medicare Other | Source: Ambulatory Visit | Attending: Radiation Oncology | Admitting: Radiation Oncology

## 2020-12-26 DIAGNOSIS — C61 Malignant neoplasm of prostate: Secondary | ICD-10-CM | POA: Diagnosis not present

## 2020-12-26 DIAGNOSIS — Z510 Encounter for antineoplastic radiation therapy: Secondary | ICD-10-CM | POA: Diagnosis not present

## 2020-12-27 ENCOUNTER — Other Ambulatory Visit: Payer: Self-pay

## 2020-12-27 ENCOUNTER — Ambulatory Visit
Admission: RE | Admit: 2020-12-27 | Discharge: 2020-12-27 | Disposition: A | Discharge status: Home / Self Care | Payer: Medicare Other | Source: Ambulatory Visit | Attending: Radiation Oncology | Admitting: Radiation Oncology

## 2020-12-27 DIAGNOSIS — C61 Malignant neoplasm of prostate: Secondary | ICD-10-CM | POA: Diagnosis not present

## 2020-12-27 DIAGNOSIS — Z510 Encounter for antineoplastic radiation therapy: Secondary | ICD-10-CM | POA: Diagnosis not present

## 2020-12-28 ENCOUNTER — Ambulatory Visit
Admission: RE | Admit: 2020-12-28 | Discharge: 2020-12-28 | Disposition: A | Discharge status: Home / Self Care | Payer: Medicare Other | Source: Ambulatory Visit | Attending: Radiation Oncology | Admitting: Radiation Oncology

## 2020-12-28 ENCOUNTER — Other Ambulatory Visit: Payer: Self-pay

## 2020-12-28 DIAGNOSIS — Z510 Encounter for antineoplastic radiation therapy: Secondary | ICD-10-CM | POA: Insufficient documentation

## 2020-12-28 DIAGNOSIS — C61 Malignant neoplasm of prostate: Secondary | ICD-10-CM | POA: Diagnosis not present

## 2020-12-29 ENCOUNTER — Ambulatory Visit
Admission: RE | Admit: 2020-12-29 | Discharge: 2020-12-29 | Disposition: A | Discharge status: Home / Self Care | Payer: Medicare Other | Source: Ambulatory Visit | Attending: Radiation Oncology | Admitting: Radiation Oncology

## 2020-12-29 DIAGNOSIS — C61 Malignant neoplasm of prostate: Secondary | ICD-10-CM | POA: Diagnosis not present

## 2020-12-29 DIAGNOSIS — Z510 Encounter for antineoplastic radiation therapy: Secondary | ICD-10-CM | POA: Diagnosis not present

## 2021-01-01 ENCOUNTER — Ambulatory Visit
Admission: RE | Admit: 2021-01-01 | Discharge: 2021-01-01 | Disposition: A | Discharge status: Home / Self Care | Payer: Medicare Other | Source: Ambulatory Visit | Attending: Radiation Oncology | Admitting: Radiation Oncology

## 2021-01-01 DIAGNOSIS — C61 Malignant neoplasm of prostate: Secondary | ICD-10-CM | POA: Diagnosis not present

## 2021-01-01 DIAGNOSIS — Z510 Encounter for antineoplastic radiation therapy: Secondary | ICD-10-CM | POA: Diagnosis not present

## 2021-01-02 ENCOUNTER — Ambulatory Visit
Admission: RE | Admit: 2021-01-02 | Discharge: 2021-01-02 | Disposition: A | Discharge status: Home / Self Care | Payer: Medicare Other | Source: Ambulatory Visit | Attending: Radiation Oncology | Admitting: Radiation Oncology

## 2021-01-02 ENCOUNTER — Encounter: Payer: Self-pay | Admitting: Urology

## 2021-01-02 ENCOUNTER — Ambulatory Visit: Payer: Medicare Other

## 2021-01-02 ENCOUNTER — Other Ambulatory Visit: Payer: Self-pay

## 2021-01-02 DIAGNOSIS — C61 Malignant neoplasm of prostate: Secondary | ICD-10-CM

## 2021-01-02 DIAGNOSIS — Z510 Encounter for antineoplastic radiation therapy: Secondary | ICD-10-CM | POA: Diagnosis not present

## 2021-01-03 ENCOUNTER — Ambulatory Visit: Payer: Medicare Other

## 2021-01-30 ENCOUNTER — Telehealth: Payer: Self-pay | Admitting: *Deleted

## 2021-01-30 NOTE — Telephone Encounter (Signed)
CALLED PATIENT TO INFORM THAT FU WILL BE DONE ON THE TELEPHONE ON 02-07-21 @ 1 PM, PATIENT AGREED TO THIS NEW TIME

## 2021-02-01 DIAGNOSIS — K219 Gastro-esophageal reflux disease without esophagitis: Secondary | ICD-10-CM | POA: Diagnosis not present

## 2021-02-01 DIAGNOSIS — I2510 Atherosclerotic heart disease of native coronary artery without angina pectoris: Secondary | ICD-10-CM | POA: Diagnosis not present

## 2021-02-01 DIAGNOSIS — K2270 Barrett's esophagus without dysplasia: Secondary | ICD-10-CM | POA: Diagnosis not present

## 2021-02-01 DIAGNOSIS — Z0000 Encounter for general adult medical examination without abnormal findings: Secondary | ICD-10-CM | POA: Diagnosis not present

## 2021-02-01 DIAGNOSIS — R7301 Impaired fasting glucose: Secondary | ICD-10-CM | POA: Diagnosis not present

## 2021-02-01 DIAGNOSIS — C61 Malignant neoplasm of prostate: Secondary | ICD-10-CM | POA: Diagnosis not present

## 2021-02-01 DIAGNOSIS — Z1389 Encounter for screening for other disorder: Secondary | ICD-10-CM | POA: Diagnosis not present

## 2021-02-01 DIAGNOSIS — E7800 Pure hypercholesterolemia, unspecified: Secondary | ICD-10-CM | POA: Diagnosis not present

## 2021-02-06 ENCOUNTER — Encounter: Payer: Self-pay | Admitting: Urology

## 2021-02-06 NOTE — Progress Notes (Addendum)
Spoke w/ patient's spouse "Colbey Wirtanen", who is cleared to speak on patient's behalf. I  verified her identity and begin the nursing interview. She states patient "Allen Cervantes is doing well. No symptoms to report at this time."  Meaningful use complete I-PSS score of 3 (mild) Currently on Flomax 0.4mg  Urology follow-up w/ [facility] -03/01/21  Sherrilee Gilles notified of Mr. Orvil Faraone 1:00pm-02/07/21 telephone appointment w/ Freeman Caldron, PA-C and verbalized understanding.  Patient preferred contact # [phone]

## 2021-02-07 ENCOUNTER — Ambulatory Visit
Admission: RE | Admit: 2021-02-07 | Discharge: 2021-02-07 | Disposition: A | Discharge status: Home / Self Care | Payer: Medicare Other | Source: Ambulatory Visit | Attending: Urology | Admitting: Urology

## 2021-02-07 DIAGNOSIS — C61 Malignant neoplasm of prostate: Secondary | ICD-10-CM

## 2021-02-07 NOTE — Progress Notes (Signed)
Radiation Oncology         [phone] ________________________________  Name: DAIEL STROHECKER MRN: [id number]  Date: 02/07/2021  DOB: [birth year]  Post Treatment Note  CC: Lavone Orn, MD  Lucas Mallow, MD  Diagnosis:   81 y.o. gentleman with stage T1c adenocarcinoma of the prostate with a Gleason's score of 4+5 and a PSA of 16     Interval Since Last Radiation:  5 weeks (concurrent with LT-ADT) 10/16/20: Insertion of radioactive I-125 seeds into the prostate gland;110 Gy, boost therapy. 2.   11/28/20 - 01/02/21: The prostate, seminal vesicles, and pelvic lymph nodes were treated to 45 Gy in 25 fractions of 1.8 Gy    Narrative: I spoke with the patient to conduct his routine scheduled 1 month follow up visit via telephone to spare the patient unnecessary potential exposure in the healthcare setting during the current COVID-19 pandemic.  The patient was notified in advance and gave permission to proceed with this visit format.  He tolerated radiation treatment relatively well with only minor urinary irritation and modest fatigue.  He reported nocturia 2-3 times per night, managed with Flomax daily.  He denied abdominal pain or bowel issues.                              On review of systems, the patient states that he is doing very well in general.  He reports almost complete resolution of his LUTS and is currently without complaints aside from some residual nocturia 2-3 times per night.  His current IPSS score is 3 and he continues taking Flomax daily as prescribed.  He specifically denies dysuria, gross hematuria, excessive daytime frequency, urgency, straining to void, incomplete bladder emptying or incontinence.  He denies any abdominal pain, nausea, vomiting, diarrhea or constipation.  He reports a healthy appetite and is maintaining his weight.  His energy level is gradually improving though still below baseline.  He continues to tolerate the ADT fairly well despite decreased stamina and  libido.  Fortunately, he is not having any hot flashes associated with the ADT.  Overall, he is quite pleased with his progress to date.  ALLERGIES:  has No Known Allergies.  Meds: Current Outpatient Medications  Medication Sig Dispense Refill   aspirin 81 MG chewable tablet Chew 81 mg by mouth daily.     brimonidine (ALPHAGAN) 0.2 % ophthalmic solution Place 1 drop into both eyes daily.     hydrochlorothiazide (HYDRODIURIL) 12.5 MG tablet Take 12.5 mg by mouth at bedtime.     HYDROcodone-acetaminophen (NORCO/VICODIN) 5-325 MG tablet Take 1 tablet by mouth every 4 (four) hours as needed for moderate pain. 20 tablet 0   ibuprofen (ADVIL,MOTRIN) 600 MG tablet Take 600 mg by mouth every 6 (six) hours as needed (elbow swelling).     Multiple Vitamin (MULTIVITAMINS PO) Take 1 tablet by mouth daily.     pantoprazole (PROTONIX) 40 MG tablet Take 40 mg by mouth at bedtime.     rosuvastatin (CRESTOR) 10 MG tablet Take 10 mg by mouth daily.     tamsulosin (FLOMAX) 0.4 MG CAPS capsule Take 0.4 mg by mouth at bedtime.     timolol (TIMOPTIC) 0.5 % ophthalmic solution Place 1 drop into both eyes daily.     No current facility-administered medications for this encounter.    Physical Findings:  vitals were not taken for this visit.  Pain Assessment Pain Score: 0-No pain/10 Unable to  assess due to telephone follow-up visit format.  Lab Findings: Lab Results  Component Value Date   WBC 9.2 10/09/2020   HGB 14.3 10/09/2020   HCT 41.9 10/09/2020   MCV 94.4 10/09/2020   PLT 224 10/09/2020     Radiographic Findings: No results found.  Impression/Plan: 1. 81 y.o. gentleman with stage T1c adenocarcinoma of the prostate with a Gleason's score of 4+5 and a PSA of 16. He will continue to follow up with urology for ongoing PSA determinations and has an appointment scheduled for labs on 02/22/2021 and then will see Dr. Gloriann Loan on 03/01/2021. He understands what to expect with regards to PSA monitoring going  forward. I will look forward to following his response to treatment via correspondence with urology, and would be happy to continue to participate in his care if clinically indicated. I talked to the patient about what to expect in the future, including his risk for erectile dysfunction and rectal bleeding. I encouraged him to call or return to the office if he has any questions regarding his previous radiation or possible radiation side effects. He was comfortable with this plan and will follow up as needed.        Nicholos Johns, PA-C

## 2021-02-07 NOTE — Progress Notes (Signed)
°  Radiation Oncology         [phone] ________________________________  Name: Allen Cervantes MRN: [id number]  Date: 01/02/2021  DOB: [birth year]  End of Treatment Note  Diagnosis:   81 y.o. gentleman with stage T1c adenocarcinoma of the prostate with a Gleason's score of 4+5 and a PSA of 16     Indication for treatment:  Curative, Definitive Radiotherapy; concurrent with LT-ADT- ADT initiated with [facility] on 07/20/20      Radiation treatment dates:    10/16/20: Brachytherapy boost 2.   11/28/20 - 01/02/21: IMRT  Site/dose:  1. Insertion of radioactive I-125 seeds into the prostate gland;110 Gy, boost therapy.   2. The prostate, seminal vesicles, and pelvic lymph nodes were treated to 45 Gy in 25 fractions of 1.8 Gy   Beams/energy:  1. A total of 19 needles were used to deposit 64 seeds in the prostate gland. The individual seed activity was 0.311 mCi. 2. The prostate, seminal vesicles, and pelvic lymph nodes were treated using VMAT intensity modulated radiotherapy delivering 6 megavolt photons. Image guidance was performed with CB-CT studies prior to each fraction. He was immobilized with a body fix lower extremity mold.   Narrative: The patient tolerated radiation treatment relatively well with only minor urinary irritation and modest fatigue.  He reported nocturia 2-3 times per night, managed with Flomax daily.  He denied abdominal pain or bowel issues.  Plan: The patient has completed radiation treatment. He will return to radiation oncology clinic for routine followup in one month. I advised him to call or return sooner if he has any questions or concerns related to his recovery or treatment. ________________________________  Sheral Apley. Tammi Klippel, M.D.

## 2021-02-14 DIAGNOSIS — L400 Psoriasis vulgaris: Secondary | ICD-10-CM | POA: Diagnosis not present

## 2021-02-22 DIAGNOSIS — C61 Malignant neoplasm of prostate: Secondary | ICD-10-CM | POA: Diagnosis not present

## 2021-03-01 DIAGNOSIS — C61 Malignant neoplasm of prostate: Secondary | ICD-10-CM | POA: Diagnosis not present

## 2021-03-05 ENCOUNTER — Telehealth: Payer: Self-pay | Admitting: *Deleted

## 2021-03-13 ENCOUNTER — Inpatient Hospital Stay: Payer: Medicare Other | Admitting: *Deleted

## 2021-03-16 ENCOUNTER — Encounter: Payer: Self-pay | Admitting: *Deleted

## 2021-03-16 ENCOUNTER — Other Ambulatory Visit: Payer: Self-pay

## 2021-03-16 ENCOUNTER — Inpatient Hospital Stay: Payer: Medicare Other | Attending: Adult Health | Admitting: *Deleted

## 2021-03-16 DIAGNOSIS — Z87891 Personal history of nicotine dependence: Secondary | ICD-10-CM

## 2021-03-16 DIAGNOSIS — C61 Malignant neoplasm of prostate: Secondary | ICD-10-CM

## 2021-03-16 NOTE — Progress Notes (Signed)
° °  2 Identifiers used for verification purposes for this appointment. No vital signs taken as this was a telephone visit. SCP reviewed and completed. Pt denies pain/fatigue. He says, "my energy level is good". Pt is still undergoing ADT,but has no hot flashes or weight gain. Urine flow is better and the frequency of urination has slowed down. His sleep is better, only having to get up to restroom twice at night. Pt does qualify for lung cancer screening so I will order. Pt has aged out from colonoscopies. He does get a skin check and has seen dermatologist. Recently had visit with PCP and urologist. He does remain active by walking and working in his yard. Vaccines are up to date. Allergies and medications have been reviewed.

## 2021-03-17 ENCOUNTER — Other Ambulatory Visit: Payer: Self-pay | Admitting: Adult Health

## 2021-03-17 DIAGNOSIS — Z87891 Personal history of nicotine dependence: Secondary | ICD-10-CM

## 2021-05-22 DIAGNOSIS — C61 Malignant neoplasm of prostate: Secondary | ICD-10-CM | POA: Diagnosis not present

## 2021-05-29 DIAGNOSIS — C61 Malignant neoplasm of prostate: Secondary | ICD-10-CM | POA: Diagnosis not present

## 2021-06-01 DIAGNOSIS — K2270 Barrett's esophagus without dysplasia: Secondary | ICD-10-CM | POA: Diagnosis not present

## 2021-08-07 DIAGNOSIS — R7301 Impaired fasting glucose: Secondary | ICD-10-CM | POA: Diagnosis not present

## 2021-08-07 DIAGNOSIS — C61 Malignant neoplasm of prostate: Secondary | ICD-10-CM | POA: Diagnosis not present

## 2021-08-07 DIAGNOSIS — K2270 Barrett's esophagus without dysplasia: Secondary | ICD-10-CM | POA: Diagnosis not present

## 2021-08-07 DIAGNOSIS — K219 Gastro-esophageal reflux disease without esophagitis: Secondary | ICD-10-CM | POA: Diagnosis not present

## 2021-08-07 DIAGNOSIS — R609 Edema, unspecified: Secondary | ICD-10-CM | POA: Diagnosis not present

## 2021-09-17 DIAGNOSIS — K449 Diaphragmatic hernia without obstruction or gangrene: Secondary | ICD-10-CM | POA: Diagnosis not present

## 2021-09-17 DIAGNOSIS — K2270 Barrett's esophagus without dysplasia: Secondary | ICD-10-CM | POA: Diagnosis not present

## 2021-09-17 DIAGNOSIS — K317 Polyp of stomach and duodenum: Secondary | ICD-10-CM | POA: Diagnosis not present

## 2021-09-20 DIAGNOSIS — C61 Malignant neoplasm of prostate: Secondary | ICD-10-CM | POA: Diagnosis not present

## 2021-09-24 DIAGNOSIS — K317 Polyp of stomach and duodenum: Secondary | ICD-10-CM | POA: Diagnosis not present

## 2021-09-24 DIAGNOSIS — K2270 Barrett's esophagus without dysplasia: Secondary | ICD-10-CM | POA: Diagnosis not present

## 2021-09-27 DIAGNOSIS — R351 Nocturia: Secondary | ICD-10-CM | POA: Diagnosis not present

## 2021-09-27 DIAGNOSIS — C61 Malignant neoplasm of prostate: Secondary | ICD-10-CM | POA: Diagnosis not present

## 2021-09-29 DIAGNOSIS — Z23 Encounter for immunization: Secondary | ICD-10-CM | POA: Diagnosis not present

## 2021-10-04 DIAGNOSIS — M25511 Pain in right shoulder: Secondary | ICD-10-CM | POA: Diagnosis not present

## 2021-11-19 DIAGNOSIS — L400 Psoriasis vulgaris: Secondary | ICD-10-CM | POA: Diagnosis not present

## 2021-11-29 DIAGNOSIS — T148XXA Other injury of unspecified body region, initial encounter: Secondary | ICD-10-CM | POA: Diagnosis not present

## 2021-11-29 DIAGNOSIS — L0390 Cellulitis, unspecified: Secondary | ICD-10-CM | POA: Diagnosis not present

## 2021-11-29 DIAGNOSIS — S8992XA Unspecified injury of left lower leg, initial encounter: Secondary | ICD-10-CM | POA: Diagnosis not present

## 2021-12-17 DIAGNOSIS — B9681 Helicobacter pylori [H. pylori] as the cause of diseases classified elsewhere: Secondary | ICD-10-CM | POA: Diagnosis not present

## 2021-12-28 DIAGNOSIS — K2270 Barrett's esophagus without dysplasia: Secondary | ICD-10-CM | POA: Diagnosis not present

## 2021-12-28 DIAGNOSIS — K317 Polyp of stomach and duodenum: Secondary | ICD-10-CM | POA: Diagnosis not present

## 2021-12-28 DIAGNOSIS — K219 Gastro-esophageal reflux disease without esophagitis: Secondary | ICD-10-CM | POA: Diagnosis not present

## 2021-12-28 DIAGNOSIS — C61 Malignant neoplasm of prostate: Secondary | ICD-10-CM | POA: Diagnosis not present

## 2021-12-28 DIAGNOSIS — Z1211 Encounter for screening for malignant neoplasm of colon: Secondary | ICD-10-CM | POA: Diagnosis not present

## 2022-01-04 DIAGNOSIS — R351 Nocturia: Secondary | ICD-10-CM | POA: Diagnosis not present

## 2022-01-04 DIAGNOSIS — C61 Malignant neoplasm of prostate: Secondary | ICD-10-CM | POA: Diagnosis not present

## 2022-02-09 ENCOUNTER — Encounter (HOSPITAL_COMMUNITY): Payer: Self-pay | Admitting: Emergency Medicine

## 2022-02-09 ENCOUNTER — Emergency Department (HOSPITAL_COMMUNITY)
Admission: EM | Admit: 2022-02-09 | Discharge: 2022-02-09 | Disposition: A | Discharge status: Home / Self Care | Payer: Medicare Other | Attending: Emergency Medicine | Admitting: Emergency Medicine

## 2022-02-09 ENCOUNTER — Other Ambulatory Visit: Payer: Self-pay

## 2022-02-09 ENCOUNTER — Emergency Department (HOSPITAL_COMMUNITY): Payer: Medicare Other

## 2022-02-09 DIAGNOSIS — I2510 Atherosclerotic heart disease of native coronary artery without angina pectoris: Secondary | ICD-10-CM | POA: Diagnosis not present

## 2022-02-09 DIAGNOSIS — R079 Chest pain, unspecified: Secondary | ICD-10-CM | POA: Diagnosis not present

## 2022-02-09 DIAGNOSIS — Z79899 Other long term (current) drug therapy: Secondary | ICD-10-CM | POA: Diagnosis not present

## 2022-02-09 DIAGNOSIS — Z7982 Long term (current) use of aspirin: Secondary | ICD-10-CM | POA: Insufficient documentation

## 2022-02-09 DIAGNOSIS — Z8546 Personal history of malignant neoplasm of prostate: Secondary | ICD-10-CM | POA: Diagnosis not present

## 2022-02-09 DIAGNOSIS — R0789 Other chest pain: Secondary | ICD-10-CM | POA: Insufficient documentation

## 2022-02-09 DIAGNOSIS — R001 Bradycardia, unspecified: Secondary | ICD-10-CM | POA: Insufficient documentation

## 2022-02-09 DIAGNOSIS — K449 Diaphragmatic hernia without obstruction or gangrene: Secondary | ICD-10-CM | POA: Diagnosis not present

## 2022-02-09 DIAGNOSIS — I10 Essential (primary) hypertension: Secondary | ICD-10-CM | POA: Diagnosis not present

## 2022-02-09 LAB — BASIC METABOLIC PANEL
Anion gap: 10 (ref 5–15)
BUN: 22 mg/dL (ref 8–23)
CO2: 24 mmol/L (ref 22–32)
Calcium: 9 mg/dL (ref 8.9–10.3)
Chloride: 102 mmol/L (ref 98–111)
Creatinine, Ser: 0.96 mg/dL (ref 0.61–1.24)
GFR, Estimated: >60 mL/min (ref >60)
Glucose, Bld: 142 mg/dL — ABNORMAL HIGH (ref 70–99)
Potassium: 3.8 mmol/L (ref 3.5–5.1)
Sodium: 136 mmol/L (ref 135–145)

## 2022-02-09 LAB — TROPONIN I (HIGH SENSITIVITY)
Troponin I (High Sensitivity): 6 ng/L (ref <18)
Troponin I (High Sensitivity): 6 ng/L (ref <18)

## 2022-02-09 LAB — CBC
HCT: 38.4 % — ABNORMAL LOW (ref 39.0–52.0)
Hemoglobin: 13 g/dL (ref 13.0–17.0)
MCH: 32.1 pg (ref 26.0–34.0)
MCHC: 33.9 g/dL (ref 30.0–36.0)
MCV: 94.8 fL (ref 80.0–100.0)
Platelets: 207 10*3/uL (ref 150–400)
RBC: 4.05 MIL/uL — ABNORMAL LOW (ref 4.22–5.81)
RDW: 13.8 % (ref 11.5–15.5)
WBC: 7.3 10*3/uL (ref 4.0–10.5)
nRBC: 0 % (ref 0.0–0.2)

## 2022-02-09 LAB — D-DIMER, QUANTITATIVE: D-Dimer, Quant: 0.72 ug/mL-FEU — ABNORMAL HIGH (ref 0.00–0.50)

## 2022-02-09 NOTE — ED Provider Triage Note (Signed)
Emergency Medicine Provider Triage Evaluation Note  Allen Cervantes , a 82 y.o. male  was evaluated in triage.  Pt complains of chest pain since last night intermittently with episodes lasting for seconds at a time, patient describes the chest pain as a squeezing sensation.  He reports is not associated with exertion, denies shortness of breath, nausea, vomiting, diaphoresis.  He does have a history of CAD, hypertension, denies previous history of diabetes, stroke, previous ACS, does endorse history of prostate cancer.  Denies any recent travel, pleuritic chest pain, hemoptysis..  Review of Systems  Positive: Chest pain Negative: Shob, nausea,vomiting, diarrhea  Physical Exam  BP (!) 154/83 (BP Location: Left Arm)   Pulse (!) 52   Temp 97.6 F (36.4 C) (Oral)   Resp 18   Ht '6\' 2"'$  (1.88 m)   Wt 115.7 kg   SpO2 99%   BMI 32.74 kg/m  Gen:   Awake, no distress   Resp:  Normal effort  MSK:   Moves extremities without difficulty  Other:    Medical Decision Making  Medically screening exam initiated at 12:10 PM.  Appropriate orders placed.  DONTEL HARSHBERGER was informed that the remainder of the evaluation will be completed by another provider, this initial triage assessment does not replace that evaluation, and the importance of remaining in the ED until their evaluation is complete.  Workup initiated in triage    Anselmo Pickler, [place] 02/09/22 1211

## 2022-02-09 NOTE — ED Triage Notes (Signed)
Patient presents with shooting, intermittent chest pain which started 2000 yesterday. "Feels like the heart." Over a years ago he was found to have plaque in his arteries.

## 2022-02-09 NOTE — Discharge Instructions (Signed)
Please follow up with your PCP and cardiologist, return to the ED if you have new or worsening chest pain

## 2022-02-09 NOTE — ED Provider Notes (Signed)
[facility] DEPT Provider Note   CSN: [id number] Arrival date & time: 02/09/22  1136     History  Chief Complaint  Patient presents with   Chest Pain    Allen Cervantes is a 82 y.o. male with past medical history significant for prostate cancer, coronary artery disease, Barrett's esophagus, hiatal hernia who presents with concern for shooting, intermittent chest pain that started around 8 PM yesterday.  Patient reports that it is left-sided in nature.  He denies exertional component, reports that if anything feels better with walking, worse with lying down or resting.  He denies shortness of breath, nausea, vomiting, diaphoresis.   Chest Pain      Home Medications Prior to Admission medications   Medication Sig Start Date End Date Taking? Authorizing Provider  aspirin 81 MG chewable tablet Chew 81 mg by mouth daily. 02/11/20   [provider]  brimonidine (ALPHAGAN) 0.2 % ophthalmic solution Place 1 drop into both eyes daily. 06/30/20   [provider]  hydrochlorothiazide (HYDRODIURIL) 12.5 MG tablet Take 12.5 mg by mouth at bedtime. 05/11/20   [provider]  Multiple Vitamin (MULTIVITAMINS PO) Take 1 tablet by mouth daily.    [provider]  pantoprazole (PROTONIX) 40 MG tablet Take 40 mg by mouth at bedtime. 03/13/17   [provider]  rosuvastatin (CRESTOR) 10 MG tablet Take 10 mg by mouth daily. 02/11/20   [provider]  tamsulosin (FLOMAX) 0.4 MG CAPS capsule Take 0.4 mg by mouth at bedtime. 04/25/20   [provider]  timolol (TIMOPTIC) 0.5 % ophthalmic solution Place 1 drop into both eyes daily. 06/30/20   [provider]      Allergies    Patient has no known allergies.    Review of Systems   Review of Systems  Cardiovascular:  Positive for chest pain.    Physical Exam Updated Vital Signs BP (!) 151/80   Pulse (!) 51   Temp 97.7 F (36.5 C) (Oral)   Resp 12   Ht  '6\' 2"'$  (1.88 m)   Wt 115.7 kg   SpO2 100%   BMI 32.74 kg/m  Physical Exam Vitals and nursing note reviewed.  Constitutional:      General: He is not in acute distress.    Appearance: Normal appearance.  HENT:     Head: Normocephalic and atraumatic.  Eyes:     General:        Right eye: No discharge.        Left eye: No discharge.  Cardiovascular:     Rate and Rhythm: Regular rhythm. Bradycardia present.     Heart sounds: No murmur heard.    No friction rub. No gallop.     Comments: Mild bradycardia, regular rhythm Pulmonary:     Effort: Pulmonary effort is normal.     Breath sounds: Normal breath sounds.  Abdominal:     General: Bowel sounds are normal.     Palpations: Abdomen is soft.  Skin:    General: Skin is warm and dry.     Capillary Refill: Capillary refill takes less than 2 seconds.  Neurological:     Mental Status: He is alert and oriented to person, place, and time.  Psychiatric:        Mood and Affect: Mood normal.        Behavior: Behavior normal.     ED Results / Procedures / Treatments   Labs (all labs ordered are listed, but  only abnormal results are displayed) Labs Reviewed  BASIC METABOLIC PANEL - Abnormal; Notable for the following components:      Result Value   Glucose, Bld 142 (*)    All other components within normal limits  CBC - Abnormal; Notable for the following components:   RBC 4.05 (*)    HCT 38.4 (*)    All other components within normal limits  D-DIMER, QUANTITATIVE - Abnormal; Notable for the following components:   D-Dimer, Quant 0.72 (*)    All other components within normal limits  TROPONIN I (HIGH SENSITIVITY)  TROPONIN I (HIGH SENSITIVITY)    EKG EKG Interpretation  Date/Time:  Saturday February 09 2022 11:58:25 EST Ventricular Rate:  57 PR Interval:  222 QRS Duration: 102 QT Interval:  440 QTC Calculation: 429 R Axis:   16 Text Interpretation: Sinus rhythm Prolonged PR interval Abnormal R-wave progression, early  transition No acute changes No significant change since last tracing Confirmed by Varney Biles [phone]) on 02/09/2022 3:55:24 PM  Radiology DG Chest 2 View  Result Date: 02/09/2022 CLINICAL DATA:  Chest pain EXAM: CHEST - 2 VIEW COMPARISON:  Chest x-ray dated 08/24/2020 FINDINGS: Heart size and mediastinal contours are stable. Hiatal hernia, small to moderate in size. Lungs are clear. No pleural effusion or pneumothorax is seen. No acute-appearing osseous abnormality. IMPRESSION: 1. No active cardiopulmonary disease. No evidence of pneumonia or pulmonary edema. 2. Hiatal hernia. Electronically Signed   By: Franki Cabot M.D.   On: 02/09/2022 13:13    Procedures Procedures    Medications Ordered in ED Medications - No data to display  ED Course/ Medical Decision Making/ A&P             HEART Score: 3                Medical Decision Making Amount and/or Complexity of Data Reviewed Labs: ordered. Radiology: ordered.   This patient is a 82 y.o. male  who presents to the ED for concern of chest pain intermittently since last night.   Differential diagnoses prior to evaluation: The emergent differential diagnosis includes, but is not limited to,  ACS, AAS, PE, Mallory-Weiss, Boerhaave's, Pneumonia, acute bronchitis, asthma or COPD exacerbation, anxiety, MSK pain or traumatic injury to the chest, acid reflux versus other. This is not an exhaustive differential.   Past Medical History / Co-morbidities: Coronary disease, acid reflux, no previous STEMI or NSTEMI  Additional history: Chart reviewed. Pertinent results include: Reviewed stress test from 2022 with some evidence of CAD but no need for acute cath or bypass  Physical Exam: Physical exam performed. The pertinent findings include: She is well-appearing with mild bradycardia, he has a regular heart rhythm otherwise he is moderately hypertensive, blood pressure 151/80, he is afebrile with normal respirations, stable oxygen  saturation on room air.  Lab Tests/Imaging studies: I personally interpreted labs/imaging and the pertinent results include: BMP unremarkable, D-dimer adjusted for age is without evidence of clot burden, no need for additional CT angio on this patient, troponin negative x 2 in context of intermittent nonexertional chest pain, CBC unremarkable.  Evidence of hiatal hernia, no other acute intrathoracic abnormality I agree with the radiologist interpretation.  Cardiac monitoring: EKG obtained and interpreted by my attending physician which shows: EKG overall stable compared to baseline, no evidence of new ischemic changes, he has prolonged PR at 222, no evidence of developing second-degree or third-degree heart block.  No other arrhythmia noted.     Disposition: After consideration of  the diagnostic results and the patients response to treatment, I feel that differential for patient's chest pain remains broad, however he has a heart score of 3, his chest pain is not exertional in nature, atypical in presentation, I do not think that he needs to be admitted to the hospital for additional chest pain workup at this time, but encourage close PCP and cardiology follow-up at his convenience if he continues to have intermittent sharp chest pains, encouraged continuing his PPI.   emergency department workup does not suggest an emergent condition requiring admission or immediate intervention beyond what has been performed at this time. The plan is: as above. The patient is safe for discharge and has been instructed to return immediately for worsening symptoms, change in symptoms or any other concerns.  Final Clinical Impression(s) / ED Diagnoses Final diagnoses:  Atypical chest pain    Rx / DC Orders ED Discharge Orders     None         Anselmo Pickler, PA-C 02/09/22 Thomasena Edis, MD 02/10/22 [phone]

## 2022-02-14 ENCOUNTER — Ambulatory Visit: Payer: Medicare Other | Admitting: Cardiology

## 2022-03-19 DIAGNOSIS — R600 Localized edema: Secondary | ICD-10-CM | POA: Diagnosis not present

## 2022-03-19 DIAGNOSIS — Z7189 Other specified counseling: Secondary | ICD-10-CM | POA: Diagnosis not present

## 2022-03-19 DIAGNOSIS — I10 Essential (primary) hypertension: Secondary | ICD-10-CM | POA: Diagnosis not present

## 2022-03-19 DIAGNOSIS — L0390 Cellulitis, unspecified: Secondary | ICD-10-CM | POA: Diagnosis not present

## 2022-04-01 ENCOUNTER — Ambulatory Visit
Admission: RE | Admit: 2022-04-01 | Discharge: 2022-04-01 | Disposition: A | Discharge status: Home / Self Care | Payer: Medicare Other | Source: Ambulatory Visit | Attending: Internal Medicine | Admitting: Internal Medicine

## 2022-04-01 ENCOUNTER — Other Ambulatory Visit: Payer: Self-pay | Admitting: Internal Medicine

## 2022-04-01 DIAGNOSIS — K449 Diaphragmatic hernia without obstruction or gangrene: Secondary | ICD-10-CM | POA: Diagnosis not present

## 2022-04-01 DIAGNOSIS — R0789 Other chest pain: Secondary | ICD-10-CM

## 2022-04-01 DIAGNOSIS — S2241XA Multiple fractures of ribs, right side, initial encounter for closed fracture: Secondary | ICD-10-CM | POA: Diagnosis not present

## 2022-04-01 DIAGNOSIS — S2239XA Fracture of one rib, unspecified side, initial encounter for closed fracture: Secondary | ICD-10-CM | POA: Diagnosis not present

## 2022-04-01 DIAGNOSIS — J984 Other disorders of lung: Secondary | ICD-10-CM | POA: Diagnosis not present

## 2022-04-09 DIAGNOSIS — C61 Malignant neoplasm of prostate: Secondary | ICD-10-CM | POA: Diagnosis not present

## 2022-05-06 DIAGNOSIS — C61 Malignant neoplasm of prostate: Secondary | ICD-10-CM | POA: Diagnosis not present

## 2022-05-20 DIAGNOSIS — J209 Acute bronchitis, unspecified: Secondary | ICD-10-CM | POA: Diagnosis not present

## 2022-05-20 DIAGNOSIS — R030 Elevated blood-pressure reading, without diagnosis of hypertension: Secondary | ICD-10-CM | POA: Diagnosis not present

## 2022-05-24 DIAGNOSIS — D225 Melanocytic nevi of trunk: Secondary | ICD-10-CM | POA: Diagnosis not present

## 2022-05-24 DIAGNOSIS — L400 Psoriasis vulgaris: Secondary | ICD-10-CM | POA: Diagnosis not present

## 2022-05-24 DIAGNOSIS — L820 Inflamed seborrheic keratosis: Secondary | ICD-10-CM | POA: Diagnosis not present

## 2022-06-06 DIAGNOSIS — C61 Malignant neoplasm of prostate: Secondary | ICD-10-CM | POA: Diagnosis not present

## 2022-06-06 DIAGNOSIS — R300 Dysuria: Secondary | ICD-10-CM | POA: Diagnosis not present

## 2022-09-05 DIAGNOSIS — R319 Hematuria, unspecified: Secondary | ICD-10-CM | POA: Diagnosis not present

## 2022-09-10 DIAGNOSIS — R310 Gross hematuria: Secondary | ICD-10-CM | POA: Diagnosis not present

## 2022-09-11 DIAGNOSIS — L820 Inflamed seborrheic keratosis: Secondary | ICD-10-CM | POA: Diagnosis not present

## 2022-09-24 DIAGNOSIS — R310 Gross hematuria: Secondary | ICD-10-CM | POA: Diagnosis not present

## 2022-09-24 DIAGNOSIS — N281 Cyst of kidney, acquired: Secondary | ICD-10-CM | POA: Diagnosis not present

## 2022-09-24 DIAGNOSIS — K449 Diaphragmatic hernia without obstruction or gangrene: Secondary | ICD-10-CM | POA: Diagnosis not present

## 2022-09-24 DIAGNOSIS — K5730 Diverticulosis of large intestine without perforation or abscess without bleeding: Secondary | ICD-10-CM | POA: Diagnosis not present

## 2022-10-02 DIAGNOSIS — Z23 Encounter for immunization: Secondary | ICD-10-CM | POA: Diagnosis not present

## 2022-10-23 DIAGNOSIS — R300 Dysuria: Secondary | ICD-10-CM | POA: Diagnosis not present

## 2022-10-23 DIAGNOSIS — C61 Malignant neoplasm of prostate: Secondary | ICD-10-CM | POA: Diagnosis not present

## 2022-10-23 DIAGNOSIS — R310 Gross hematuria: Secondary | ICD-10-CM | POA: Diagnosis not present

## 2022-11-25 DIAGNOSIS — K5730 Diverticulosis of large intestine without perforation or abscess without bleeding: Secondary | ICD-10-CM | POA: Diagnosis not present

## 2022-11-25 DIAGNOSIS — Z1211 Encounter for screening for malignant neoplasm of colon: Secondary | ICD-10-CM | POA: Diagnosis not present

## 2022-11-25 DIAGNOSIS — D124 Benign neoplasm of descending colon: Secondary | ICD-10-CM | POA: Diagnosis not present

## 2022-11-25 DIAGNOSIS — K648 Other hemorrhoids: Secondary | ICD-10-CM | POA: Diagnosis not present

## 2022-11-25 DIAGNOSIS — K5520 Angiodysplasia of colon without hemorrhage: Secondary | ICD-10-CM | POA: Diagnosis not present

## 2022-11-26 DIAGNOSIS — Z0000 Encounter for general adult medical examination without abnormal findings: Secondary | ICD-10-CM | POA: Diagnosis not present

## 2022-11-26 DIAGNOSIS — Z1389 Encounter for screening for other disorder: Secondary | ICD-10-CM | POA: Diagnosis not present

## 2022-11-26 DIAGNOSIS — K2270 Barrett's esophagus without dysplasia: Secondary | ICD-10-CM | POA: Diagnosis not present

## 2022-11-26 DIAGNOSIS — I872 Venous insufficiency (chronic) (peripheral): Secondary | ICD-10-CM | POA: Diagnosis not present

## 2022-11-26 DIAGNOSIS — I2510 Atherosclerotic heart disease of native coronary artery without angina pectoris: Secondary | ICD-10-CM | POA: Diagnosis not present

## 2022-11-26 DIAGNOSIS — I10 Essential (primary) hypertension: Secondary | ICD-10-CM | POA: Diagnosis not present

## 2022-11-26 DIAGNOSIS — Z8546 Personal history of malignant neoplasm of prostate: Secondary | ICD-10-CM | POA: Diagnosis not present

## 2022-11-26 DIAGNOSIS — R7303 Prediabetes: Secondary | ICD-10-CM | POA: Diagnosis not present

## 2022-11-26 DIAGNOSIS — I700 Atherosclerosis of aorta: Secondary | ICD-10-CM | POA: Diagnosis not present

## 2022-11-26 DIAGNOSIS — E7800 Pure hypercholesterolemia, unspecified: Secondary | ICD-10-CM | POA: Diagnosis not present

## 2022-11-27 DIAGNOSIS — D124 Benign neoplasm of descending colon: Secondary | ICD-10-CM | POA: Diagnosis not present

## 2022-12-16 DIAGNOSIS — H401131 Primary open-angle glaucoma, bilateral, mild stage: Secondary | ICD-10-CM | POA: Diagnosis not present

## 2022-12-16 DIAGNOSIS — H04123 Dry eye syndrome of bilateral lacrimal glands: Secondary | ICD-10-CM | POA: Diagnosis not present

## 2023-03-21 DIAGNOSIS — H401131 Primary open-angle glaucoma, bilateral, mild stage: Secondary | ICD-10-CM | POA: Diagnosis not present

## 2023-03-24 DIAGNOSIS — C61 Malignant neoplasm of prostate: Secondary | ICD-10-CM | POA: Diagnosis not present

## 2023-03-31 DIAGNOSIS — E291 Testicular hypofunction: Secondary | ICD-10-CM | POA: Diagnosis not present

## 2023-03-31 DIAGNOSIS — C61 Malignant neoplasm of prostate: Secondary | ICD-10-CM | POA: Diagnosis not present

## 2023-03-31 DIAGNOSIS — R310 Gross hematuria: Secondary | ICD-10-CM | POA: Diagnosis not present

## 2023-04-30 DIAGNOSIS — L400 Psoriasis vulgaris: Secondary | ICD-10-CM | POA: Diagnosis not present

## 2023-05-13 ENCOUNTER — Other Ambulatory Visit: Payer: Self-pay

## 2023-05-13 ENCOUNTER — Encounter (HOSPITAL_COMMUNITY): Payer: Self-pay | Admitting: Emergency Medicine

## 2023-05-13 ENCOUNTER — Emergency Department (HOSPITAL_COMMUNITY)
Admission: EM | Admit: 2023-05-13 | Discharge: 2023-05-13 | Disposition: A | Discharge status: Home / Self Care | Attending: Emergency Medicine | Admitting: Emergency Medicine

## 2023-05-13 DIAGNOSIS — N139 Obstructive and reflux uropathy, unspecified: Secondary | ICD-10-CM | POA: Insufficient documentation

## 2023-05-13 DIAGNOSIS — Z8546 Personal history of malignant neoplasm of prostate: Secondary | ICD-10-CM | POA: Insufficient documentation

## 2023-05-13 DIAGNOSIS — R319 Hematuria, unspecified: Secondary | ICD-10-CM

## 2023-05-13 DIAGNOSIS — Z7982 Long term (current) use of aspirin: Secondary | ICD-10-CM | POA: Insufficient documentation

## 2023-05-13 LAB — URINALYSIS, ROUTINE W REFLEX MICROSCOPIC
Bilirubin Urine: NEGATIVE
Glucose, UA: NEGATIVE mg/dL
Ketones, ur: NEGATIVE mg/dL
Leukocytes,Ua: NEGATIVE
Nitrite: NEGATIVE
Protein, ur: 30 mg/dL — AB
RBC / HPF: >50 RBC/hpf (ref 0–5)
Specific Gravity, Urine: 1.012 (ref 1.005–1.030)
WBC, UA: >50 WBC/hpf (ref 0–5)
pH: 7 (ref 5.0–8.0)

## 2023-05-13 MED ORDER — DIAZEPAM 2 MG PO TABS
2.0000 mg | ORAL_TABLET | Freq: Once | ORAL | Status: AC
  Administered 2023-05-13: 2 mg via ORAL
  Filled 2023-05-13: qty 1

## 2023-05-13 MED ORDER — LIDOCAINE HCL URETHRAL/MUCOSAL 2 % EX GEL
1.0000 | Freq: Once | CUTANEOUS | Status: AC
  Administered 2023-05-13: 1 via URETHRAL
  Filled 2023-05-13: qty 11

## 2023-05-13 NOTE — ED Notes (Signed)
 Attempted to collect UA sample. Pt unable to void. Bladder scanned pt, noted 547 ml. ED MD notified

## 2023-05-13 NOTE — ED Provider Notes (Signed)
 [place] EMERGENCY DEPARTMENT AT [facility] Provider Note   CSN: [id number] Arrival date & time: 05/13/23  4098     History  Chief Complaint  Patient presents with   Hematuria    DONTREAL MIERA is a 83 y.o. male.  83 year old male here today for hematuria, difficulty in emptying his bladder over the last 2 days.  Patient has history of prostate cancer, had radiation seeding performed previously.  He is not on any blood thinners.   Hematuria       Home Medications Prior to Admission medications   Medication Sig Start Date End Date Taking? Authorizing Provider  aspirin 81 MG chewable tablet Chew 81 mg by mouth daily. 02/11/20   [provider]  brimonidine (ALPHAGAN) 0.2 % ophthalmic solution Place 1 drop into both eyes daily. 06/30/20   [provider]  hydrochlorothiazide (HYDRODIURIL) 12.5 MG tablet Take 12.5 mg by mouth at bedtime. 05/11/20   [provider]  Multiple Vitamin (MULTIVITAMINS PO) Take 1 tablet by mouth daily.    [provider]  pantoprazole (PROTONIX) 40 MG tablet Take 40 mg by mouth at bedtime. 03/13/17   [provider]  rosuvastatin (CRESTOR) 10 MG tablet Take 10 mg by mouth daily. 02/11/20   [provider]  tamsulosin (FLOMAX) 0.4 MG CAPS capsule Take 0.4 mg by mouth at bedtime. 04/25/20   [provider]  timolol (TIMOPTIC) 0.5 % ophthalmic solution Place 1 drop into both eyes daily. 06/30/20   [provider]      Allergies    Patient has no known allergies.    Review of Systems   Review of Systems  Genitourinary:  Positive for hematuria.    Physical Exam Updated Vital Signs BP (!) 178/74   Pulse 65   Temp 97.9 F (36.6 C) (Oral)   Resp 18   Ht 6\' 2"  (1.88 m)   Wt 115.7 kg   SpO2 100%   BMI 32.74 kg/m  Physical Exam Vitals reviewed.  Pulmonary:     Effort: Pulmonary effort is normal.  Abdominal:     General: Abdomen is flat.     Palpations: Abdomen is  soft.  Neurological:     Mental Status: He is alert.     ED Results / Procedures / Treatments   Labs (all labs ordered are listed, but only abnormal results are displayed) Labs Reviewed  URINALYSIS, ROUTINE W REFLEX MICROSCOPIC - Abnormal; Notable for the following components:      Result Value   APPearance HAZY (*)    Hgb urine dipstick LARGE (*)    Protein, ur 30 (*)    Bacteria, UA RARE (*)    All other components within normal limits    EKG None  Radiology No results found.  Procedures Procedures    Medications Ordered in ED Medications  lidocaine (XYLOCAINE) 2 % jelly 1 Application (1 Application Urethral Given 05/13/23 0755)  diazepam (VALIUM) tablet 2 mg (2 mg Oral Given 05/13/23 0755)    ED Course/ Medical Decision Making/ A&P                                 Medical Decision Making 83 year old male here today for urinary retention, hematuria.  Plan is for the patient's history of prostate cancer, likely source of his hematuria and difficulty with emptying his bladder.  Went to evaluate the patient, he was going to attempt to  urinate again rather than have catheter placed.  9:20 AM-patient was unable to void.  Had Foley catheter placed.  Patient feeling significantly better.  Urinalysis reviewed.  No infection.  Catheter continues to drain yellow urine, no clots.  Patient is already established with a urologist.  Is going to call them to set up a follow-up appointment.  Amount and/or Complexity of Data Reviewed Labs: ordered.  Risk Prescription drug management.           Final Clinical Impression(s) / ED Diagnoses Final diagnoses:  Hematuria, unspecified type  Urinary obstruction    Rx / DC Orders ED Discharge Orders     None         Afton Horse T, DO 05/13/23 [phone]

## 2023-05-13 NOTE — ED Triage Notes (Addendum)
 Pt reports intermittent hematuria x 2 days, pt further states he is unable to empty his bladder

## 2023-05-13 NOTE — Discharge Instructions (Addendum)
 Please call your urologist today to set up a follow-up appointment.  You can tell them that you went to the emergency room and had a Foley catheter placed because you are unable to urinate.  Your urine did not show signs of infection.

## 2023-05-17 ENCOUNTER — Emergency Department (HOSPITAL_COMMUNITY)
Admission: EM | Admit: 2023-05-17 | Discharge: 2023-05-17 | Disposition: A | Discharge status: Home / Self Care | Attending: Emergency Medicine | Admitting: Emergency Medicine

## 2023-05-17 ENCOUNTER — Other Ambulatory Visit: Payer: Self-pay

## 2023-05-17 DIAGNOSIS — R319 Hematuria, unspecified: Secondary | ICD-10-CM | POA: Diagnosis not present

## 2023-05-17 DIAGNOSIS — Z7982 Long term (current) use of aspirin: Secondary | ICD-10-CM | POA: Insufficient documentation

## 2023-05-17 DIAGNOSIS — Z79899 Other long term (current) drug therapy: Secondary | ICD-10-CM | POA: Diagnosis not present

## 2023-05-17 DIAGNOSIS — Y732 Prosthetic and other implants, materials and accessory gastroenterology and urology devices associated with adverse incidents: Secondary | ICD-10-CM | POA: Insufficient documentation

## 2023-05-17 DIAGNOSIS — T839XXA Unspecified complication of genitourinary prosthetic device, implant and graft, initial encounter: Secondary | ICD-10-CM

## 2023-05-17 DIAGNOSIS — T83091A Other mechanical complication of indwelling urethral catheter, initial encounter: Secondary | ICD-10-CM | POA: Diagnosis not present

## 2023-05-17 LAB — CBC
HCT: 35.3 % — ABNORMAL LOW (ref 39.0–52.0)
Hemoglobin: 12.1 g/dL — ABNORMAL LOW (ref 13.0–17.0)
MCH: 33.7 pg (ref 26.0–34.0)
MCHC: 34.3 g/dL (ref 30.0–36.0)
MCV: 98.3 fL (ref 80.0–100.0)
Platelets: 182 10*3/uL (ref 150–400)
RBC: 3.59 MIL/uL — ABNORMAL LOW (ref 4.22–5.81)
RDW: 14.4 % (ref 11.5–15.5)
WBC: 10 10*3/uL (ref 4.0–10.5)
nRBC: 0 % (ref 0.0–0.2)

## 2023-05-17 LAB — BASIC METABOLIC PANEL WITH GFR
Anion gap: 11 (ref 5–15)
BUN: 27 mg/dL — ABNORMAL HIGH (ref 8–23)
CO2: 25 mmol/L (ref 22–32)
Calcium: 9.1 mg/dL (ref 8.9–10.3)
Chloride: 102 mmol/L (ref 98–111)
Creatinine, Ser: 1.3 mg/dL — ABNORMAL HIGH (ref 0.61–1.24)
GFR, Estimated: 56 mL/min — ABNORMAL LOW (ref >60)
Glucose, Bld: 104 mg/dL — ABNORMAL HIGH (ref 70–99)
Potassium: 4.1 mmol/L (ref 3.5–5.1)
Sodium: 138 mmol/L (ref 135–145)

## 2023-05-17 NOTE — ED Provider Notes (Signed)
 [place] EMERGENCY DEPARTMENT AT [facility] Provider Note   CSN: [id number] Arrival date & time: 05/17/23  1645     History  Chief Complaint  Patient presents with   Catheter Issue    Allen Cervantes is a 83 y.o. male.  HPI Reports he had a Foley catheter placed 4 days ago because urine was not draining.  He reports he started seeing blood in the catheter for several days.  Sometimes it looked cranberry colored and there were intermittently some clots.  Patient reports today there is not visible blood but he thinks it might be clogged.  He reports there has been very little urine output from the catheter today.  Patient denies he has significant abdominal pain.  He does not have flank pain or fever.  No nausea or vomiting.  He reports has been eating and drinking per usual.    Home Medications Prior to Admission medications   Medication Sig Start Date End Date Taking? Authorizing Provider  aspirin 81 MG chewable tablet Chew 81 mg by mouth daily. 02/11/20   [provider]  brimonidine (ALPHAGAN) 0.2 % ophthalmic solution Place 1 drop into both eyes daily. 06/30/20   [provider]  hydrochlorothiazide (HYDRODIURIL) 12.5 MG tablet Take 12.5 mg by mouth at bedtime. 05/11/20   [provider]  Multiple Vitamin (MULTIVITAMINS PO) Take 1 tablet by mouth daily.    [provider]  pantoprazole (PROTONIX) 40 MG tablet Take 40 mg by mouth at bedtime. 03/13/17   [provider]  rosuvastatin (CRESTOR) 10 MG tablet Take 10 mg by mouth daily. 02/11/20   [provider]  tamsulosin (FLOMAX) 0.4 MG CAPS capsule Take 0.4 mg by mouth at bedtime. 04/25/20   [provider]  timolol (TIMOPTIC) 0.5 % ophthalmic solution Place 1 drop into both eyes daily. 06/30/20   [provider]      Allergies    Patient has no known allergies.    Review of Systems   Review of Systems  Physical Exam Updated Vital Signs BP (!) 147/67    Pulse (!) 58   Temp 99.3 F (37.4 C) (Oral)   Resp 18   Ht 6\' 2"  (1.88 m)   Wt 115.7 kg   SpO2 95%   BMI 32.74 kg/m  Physical Exam Constitutional:      Comments: Nontoxic in appearance.  HENT:     Mouth/Throat:     Pharynx: Oropharynx is clear.  Eyes:     Extraocular Movements: Extraocular movements intact.  Cardiovascular:     Rate and Rhythm: Normal rate and regular rhythm.  Pulmonary:     Effort: Pulmonary effort is normal.     Breath sounds: Normal breath sounds.  Abdominal:     Comments: Abdomen soft.  Nontender to palpation.  No distinct suprapubic mass or fullness.  Genitourinary:    Comments: Foley catheter is within the urethra.  There is some fresh dried blood at the urethral meatus and on the Foley catheter.  No active bleeding.  Contents of Foley catheter bag is a small amount of clear yellow urine.  No visible clots or blood in the catheter bag at this time. Musculoskeletal:        General: Normal range of motion.  Skin:    General: Skin is warm and dry.  Neurological:     General: No focal deficit present.     Mental Status: He is oriented to person, place, and time.  Psychiatric:  Mood and Affect: Mood normal.     ED Results / Procedures / Treatments   Labs (all labs ordered are listed, but only abnormal results are displayed) Labs Reviewed  BASIC METABOLIC PANEL WITH GFR - Abnormal; Notable for the following components:      Result Value   Glucose, Bld 104 (*)    BUN 27 (*)    Creatinine, Ser 1.30 (*)    GFR, Estimated 56 (*)    All other components within normal limits  CBC - Abnormal; Notable for the following components:   RBC 3.59 (*)    Hemoglobin 12.1 (*)    HCT 35.3 (*)    All other components within normal limits    EKG None  Radiology No results found.  Procedures Procedures    Medications Ordered in ED Medications - No data to display  ED Course/ Medical Decision Making/ A&P                                  Medical Decision Making Amount and/or Complexity of Data Reviewed Labs: ordered.   Presents as outlined with decreased output from Foley catheter.  He had previously seen blood.  Differential diagnosis includes clot in the catheter.  Or other obstruction.  Patient is nontoxic.  He has not had a fever.  He has not had significant pain.  I have used bedside ultrasound.  Catheter balloon is visible within the bladder.  Moderate amount of urine present but not significantly distended.  Will obtain basic lab work and irrigate the catheter. Sodium normal potassium normal BUN 27 creatinine 1.3 GFR 56.  White count 10.  H&H 12 and 35.  After catheter irrigated, patient had output of about 400 cc of rusty colored urine.  No visible clots.  At this time consistent with some sediment clogging but per nursing, flushed without difficulty.  This time I do not think there is retained clot obstructing the catheter.  Patient has urine output now and does not seem to have any large clot.  Catheter is draining freely currently and patient is otherwise well in appearance without fever or signs of UTI, will plan for discharge.  I instructed for aggressive oral hydration and continued observation for output from the catheter.  Patient has follow-up with urology Tuesday morning.  He is aware if he has any difficulties he needs to return to the emergency department.         Final Clinical Impression(s) / ED Diagnoses Final diagnoses:  Problem with Foley catheter, initial encounter (HCC)  Hematuria, unspecified type    Rx / DC Orders ED Discharge Orders     None         Wynetta Heckle, MD 05/17/23 1934

## 2023-05-17 NOTE — Discharge Instructions (Addendum)
 1.  See your urologist for recheck as soon as possible.  You will need further evaluation for blood that you had seen in the urine. 2.  Continue to care for your Foley catheter as instructed.  Return if you feel that there is any blockage or decreased output of urine. 3.  Return if you get a fever pain or other concerning changes.

## 2023-05-17 NOTE — ED Triage Notes (Addendum)
 Patient to ED by POV with c/o catheter issues. Per patient he had a catheter placed Tuesday states he has had bleeding daily and believes cath is clogged c/o ABD discomfort.

## 2023-05-20 DIAGNOSIS — R310 Gross hematuria: Secondary | ICD-10-CM | POA: Diagnosis not present

## 2023-05-20 DIAGNOSIS — R338 Other retention of urine: Secondary | ICD-10-CM | POA: Diagnosis not present

## 2023-05-22 ENCOUNTER — Other Ambulatory Visit: Payer: Self-pay

## 2023-05-22 ENCOUNTER — Encounter (HOSPITAL_COMMUNITY): Payer: Self-pay | Admitting: Emergency Medicine

## 2023-05-22 ENCOUNTER — Emergency Department (HOSPITAL_COMMUNITY)
Admission: EM | Admit: 2023-05-22 | Discharge: 2023-05-22 | Disposition: A | Discharge status: Home / Self Care | Attending: Emergency Medicine | Admitting: Emergency Medicine

## 2023-05-22 DIAGNOSIS — Z8546 Personal history of malignant neoplasm of prostate: Secondary | ICD-10-CM | POA: Diagnosis not present

## 2023-05-22 DIAGNOSIS — Z7982 Long term (current) use of aspirin: Secondary | ICD-10-CM | POA: Diagnosis not present

## 2023-05-22 DIAGNOSIS — Y829 Unspecified medical devices associated with adverse incidents: Secondary | ICD-10-CM | POA: Insufficient documentation

## 2023-05-22 DIAGNOSIS — T83098A Other mechanical complication of other indwelling urethral catheter, initial encounter: Secondary | ICD-10-CM | POA: Diagnosis not present

## 2023-05-22 DIAGNOSIS — T83091D Other mechanical complication of indwelling urethral catheter, subsequent encounter: Secondary | ICD-10-CM | POA: Diagnosis not present

## 2023-05-22 DIAGNOSIS — R319 Hematuria, unspecified: Secondary | ICD-10-CM | POA: Insufficient documentation

## 2023-05-22 DIAGNOSIS — T839XXD Unspecified complication of genitourinary prosthetic device, implant and graft, subsequent encounter: Secondary | ICD-10-CM

## 2023-05-22 LAB — COMPREHENSIVE METABOLIC PANEL WITH GFR
ALT: 22 U/L (ref 0–44)
AST: 22 U/L (ref 15–41)
Albumin: 4.1 g/dL (ref 3.5–5.0)
Alkaline Phosphatase: 64 U/L (ref 38–126)
Anion gap: 10 (ref 5–15)
BUN: 23 mg/dL (ref 8–23)
CO2: 27 mmol/L (ref 22–32)
Calcium: 9.1 mg/dL (ref 8.9–10.3)
Chloride: 102 mmol/L (ref 98–111)
Creatinine, Ser: 1.11 mg/dL (ref 0.61–1.24)
GFR, Estimated: >60 mL/min (ref >60)
Glucose, Bld: 108 mg/dL — ABNORMAL HIGH (ref 70–99)
Potassium: 3.5 mmol/L (ref 3.5–5.1)
Sodium: 139 mmol/L (ref 135–145)
Total Bilirubin: 0.6 mg/dL (ref 0.0–1.2)
Total Protein: 7.7 g/dL (ref 6.5–8.1)

## 2023-05-22 LAB — CBC WITH DIFFERENTIAL/PLATELET
Abs Immature Granulocytes: 0.1 10*3/uL — ABNORMAL HIGH (ref 0.00–0.07)
Basophils Absolute: 0.1 10*3/uL (ref 0.0–0.1)
Basophils Relative: 1 %
Eosinophils Absolute: 0.1 10*3/uL (ref 0.0–0.5)
Eosinophils Relative: 1 %
HCT: 36.4 % — ABNORMAL LOW (ref 39.0–52.0)
Hemoglobin: 12.2 g/dL — ABNORMAL LOW (ref 13.0–17.0)
Immature Granulocytes: 1 %
Lymphocytes Relative: 11 %
Lymphs Abs: 1.1 10*3/uL (ref 0.7–4.0)
MCH: 33.2 pg (ref 26.0–34.0)
MCHC: 33.5 g/dL (ref 30.0–36.0)
MCV: 99.2 fL (ref 80.0–100.0)
Monocytes Absolute: 1.5 10*3/uL — ABNORMAL HIGH (ref 0.1–1.0)
Monocytes Relative: 15 %
Neutro Abs: 7.5 10*3/uL (ref 1.7–7.7)
Neutrophils Relative %: 71 %
Platelets: 189 10*3/uL (ref 150–400)
RBC: 3.67 MIL/uL — ABNORMAL LOW (ref 4.22–5.81)
RDW: 14.6 % (ref 11.5–15.5)
WBC: 10.4 10*3/uL (ref 4.0–10.5)
nRBC: 0 % (ref 0.0–0.2)

## 2023-05-22 MED ORDER — SODIUM CHLORIDE 0.9 % IV BOLUS
1000.0000 mL | Freq: Once | INTRAVENOUS | Status: AC
  Administered 2023-05-22: 1000 mL via INTRAVENOUS

## 2023-05-22 NOTE — ED Notes (Signed)
 Bladder scanned him and it was about 408 ml

## 2023-05-22 NOTE — ED Notes (Signed)
 He last noticed drainage from his catheter around 0900 today.

## 2023-05-22 NOTE — ED Provider Notes (Signed)
 [place] EMERGENCY DEPARTMENT AT [facility] Provider Note   CSN: [id number] Arrival date & time: 05/22/23  1930     History No chief complaint on file.   Allen Cervantes is a 83 y.o. male. Patient with past history significant for prostate cancer presents to the ED today with concerns of a blocked catheter. He states his urinary catheter is not currently flushing any urine. Reports still passing mostly blood and clots. Pain in the suprapubic region due to urine retention. Was seen by urology yesterday at Dr. Brinda Canary office and had discussion about possible catheter removal but catheter was not removed.  HPI     Home Medications Prior to Admission medications   Medication Sig Start Date End Date Taking? Authorizing Provider  aspirin 81 MG chewable tablet Chew 81 mg by mouth daily. 02/11/20   [provider]  brimonidine (ALPHAGAN) 0.2 % ophthalmic solution Place 1 drop into both eyes daily. 06/30/20   [provider]  hydrochlorothiazide (HYDRODIURIL) 12.5 MG tablet Take 12.5 mg by mouth at bedtime. 05/11/20   [provider]  Multiple Vitamin (MULTIVITAMINS PO) Take 1 tablet by mouth daily.    [provider]  pantoprazole (PROTONIX) 40 MG tablet Take 40 mg by mouth at bedtime. 03/13/17   [provider]  rosuvastatin (CRESTOR) 10 MG tablet Take 10 mg by mouth daily. 02/11/20   [provider]  tamsulosin (FLOMAX) 0.4 MG CAPS capsule Take 0.4 mg by mouth at bedtime. 04/25/20   [provider]  timolol (TIMOPTIC) 0.5 % ophthalmic solution Place 1 drop into both eyes daily. 06/30/20   [provider]      Allergies    Patient has no known allergies.    Review of Systems   Review of Systems  Physical Exam Updated Vital Signs BP (!) 145/85 (BP Location: Left Arm)   Pulse 64   Temp 98.3 F (36.8 C) (Oral)   Resp 17   SpO2 100%  Physical Exam  ED Results / Procedures / Treatments   Labs (all labs  ordered are listed, but only abnormal results are displayed) Labs Reviewed  CBC WITH DIFFERENTIAL/PLATELET - Abnormal; Notable for the following components:      Result Value   RBC 3.67 (*)    Hemoglobin 12.2 (*)    HCT 36.4 (*)    Monocytes Absolute 1.5 (*)    Abs Immature Granulocytes 0.10 (*)    All other components within normal limits  COMPREHENSIVE METABOLIC PANEL WITH GFR - Abnormal; Notable for the following components:   Glucose, Bld 108 (*)    All other components within normal limits    EKG None  Radiology No results found.  Procedures Procedures    Medications Ordered in ED Medications  sodium chloride  0.9 % bolus 1,000 mL (1,000 mLs Intravenous Bolus 05/22/23 2203)    ED Course/ Medical Decision Making/ A&P                                 Medical Decision Making Amount and/or Complexity of Data Reviewed Labs: ordered.   This patient presents to the ED for concern of catheter concerns.  Differential diagnosis includes occluded catheter, improperly draining catheter, UTI, AKI   Lab Tests:  I Ordered, and personally interpreted labs.  The pertinent results include: CBC unremarkable, CMP unremarkable with no evidence of AKI or renal dysfunction   Medicines ordered and prescription drug  management:  I ordered medication including fluids for hydration Reevaluation of the patient after these medicines showed that the patient improved I have reviewed the patients home medicines and have made adjustments as needed   Problem List / ED Course:  Patient presents the emergency department today with concerns of catheter issues.  Reports that he has a clogged catheter that has not been draining for several hours now.  Endorsing some discomfort in the suprapubic region.  Prior to this, denies any recent dysuria.  He states he has had ongoing hematuria for some time and has been seen by urology with Dr. Daron Ellen office.  He saw them yesterday and is planning on being  seen by Dr. Parke Boll early next week to discuss possible catheter removal.  No recent fevers. Physical exam is concerning for Foley catheter with no urine flow present.  Will attempt irrigation to try to clear the clots.  Basic labs ordered for assessment of renal function as well as hemoglobin to ensure patient is not actively hemorrhaging from bladder. Will irrigate and reassess. Bladder irrigated with approximately of rust colored urine drained. A few small clots present. Urine flowing freely at this time and patient states this is the lightest his urine has looked since. Reports improvement in bladder discomfort he had been feeling. Fluids ordered to allow for urine to continue to flush out and ensure not repeat obstruction of catheter. Advised patient that if catheter remains flowing, will be able to discharge home with urology follow up with Dr. Parke Boll next week.  10:26 PM Care of Allen Cervantes transferred to [facility] and Dr. Wallis Gun at the end of my shift as the patient will require reassessment once labs/imaging have resulted. Patient presentation, ED course, and plan of care discussed with review of all pertinent labs and imaging. Please see his/her note for further details regarding further ED course and disposition. Plan at time of handoff is reassess after fluids finish and ensure foley catheter is still draining. If flowing well, can discharge home with planned urology follow up for early next week with Dr. Parke Boll. This may be altered or completely changed at the discretion of the oncoming team pending results of further workup.     Final Clinical Impression(s) / ED Diagnoses Final diagnoses:  Problem with Foley catheter, subsequent encounter  Hematuria, unspecified type    Rx / DC Orders ED Discharge Orders     None         Oretha Birch 05/22/23 2226    Dalene Duck, MD 05/22/23 2325

## 2023-05-22 NOTE — Discharge Instructions (Addendum)
 You have blood in your urine.  Blood counts and kidney function is normal today.  Please stay hydrated and see Dr. Parke Boll on Tuesday  Return to ER if you have large clots and severe pain or catheter not draining

## 2023-05-22 NOTE — ED Triage Notes (Signed)
 Patient presents due to what he believes to be a clogged catheter. He has had this issue before. This trip he is hoping to be able to remove it.

## 2023-05-22 NOTE — ED Notes (Signed)
 Foley catheter irrigated multiple times to attempt to clear any potential blockages. Urine noted to be a dark red/orange color. Approximately 450cc of urine was pulled from the catheter. Standard foley catheter drainage bag placed to allow for more draining. Urine color lightened to a pale yellow/orange color after flushing.

## 2023-05-27 ENCOUNTER — Emergency Department (HOSPITAL_COMMUNITY)
Admission: EM | Admit: 2023-05-27 | Discharge: 2023-05-28 | Disposition: A | Discharge status: Home / Self Care | Attending: Emergency Medicine | Admitting: Emergency Medicine

## 2023-05-27 ENCOUNTER — Other Ambulatory Visit: Payer: Self-pay

## 2023-05-27 DIAGNOSIS — R310 Gross hematuria: Secondary | ICD-10-CM | POA: Diagnosis not present

## 2023-05-27 DIAGNOSIS — Z7982 Long term (current) use of aspirin: Secondary | ICD-10-CM | POA: Diagnosis not present

## 2023-05-27 DIAGNOSIS — R1030 Lower abdominal pain, unspecified: Secondary | ICD-10-CM | POA: Diagnosis not present

## 2023-05-27 DIAGNOSIS — R339 Retention of urine, unspecified: Secondary | ICD-10-CM | POA: Diagnosis not present

## 2023-05-27 DIAGNOSIS — C61 Malignant neoplasm of prostate: Secondary | ICD-10-CM | POA: Diagnosis not present

## 2023-05-27 DIAGNOSIS — R319 Hematuria, unspecified: Secondary | ICD-10-CM | POA: Diagnosis present

## 2023-05-27 NOTE — ED Provider Notes (Signed)
 [place] EMERGENCY DEPARTMENT AT [facility] Provider Note   CSN: [id number] Arrival date & time: 05/27/23  2334     History  Chief Complaint  Patient presents with   Hematuria    Allen Cervantes is a 83 y.o. male.  Presents to the emergency department for evaluation of urinary retention.  Patient reports that he was seen 2 weeks ago with similar symptoms, had a Foley catheter placed.  He was seen by urology today, still had some blood clots in his bladder but they were broken up by urology.  The catheter was removed and he was able to spontaneously void in the office.  Through the course of today, however, he has become blocked again, cannot pass his urine and is uncomfortable.       Home Medications Prior to Admission medications   Medication Sig Start Date End Date Taking? Authorizing Provider  aspirin 81 MG chewable tablet Chew 81 mg by mouth daily. 02/11/20   [provider]  brimonidine (ALPHAGAN) 0.2 % ophthalmic solution Place 1 drop into both eyes daily. 06/30/20   [provider]  hydrochlorothiazide (HYDRODIURIL) 12.5 MG tablet Take 12.5 mg by mouth at bedtime. 05/11/20   [provider]  Multiple Vitamin (MULTIVITAMINS PO) Take 1 tablet by mouth daily.    [provider]  pantoprazole (PROTONIX) 40 MG tablet Take 40 mg by mouth at bedtime. 03/13/17   [provider]  rosuvastatin (CRESTOR) 10 MG tablet Take 10 mg by mouth daily. 02/11/20   [provider]  tamsulosin (FLOMAX) 0.4 MG CAPS capsule Take 0.4 mg by mouth at bedtime. 04/25/20   [provider]  timolol (TIMOPTIC) 0.5 % ophthalmic solution Place 1 drop into both eyes daily. 06/30/20   [provider]      Allergies    Patient has no known allergies.    Review of Systems   Review of Systems  Physical Exam Updated Vital Signs BP 129/86   Pulse 74   Temp 98 F (36.7 C) (Oral)   Resp 17   SpO2 99%  Physical Exam Vitals and  nursing note reviewed.  Constitutional:      General: He is not in acute distress.    Appearance: He is well-developed.  HENT:     Head: Normocephalic and atraumatic.     Mouth/Throat:     Mouth: Mucous membranes are moist.  Eyes:     General: Vision grossly intact. Gaze aligned appropriately.     Extraocular Movements: Extraocular movements intact.     Conjunctiva/sclera: Conjunctivae normal.  Cardiovascular:     Rate and Rhythm: Normal rate and regular rhythm.     Pulses: Normal pulses.     Heart sounds: Normal heart sounds, S1 normal and S2 normal. No murmur heard.    No friction rub. No gallop.  Pulmonary:     Effort: Pulmonary effort is normal. No respiratory distress.     Breath sounds: Normal breath sounds.  Abdominal:     Palpations: Abdomen is soft.     Tenderness: There is abdominal tenderness in the suprapubic area. There is no guarding or rebound.     Hernia: No hernia is present.  Musculoskeletal:        General: No swelling.     Cervical back: Full passive range of motion without pain, normal range of motion and neck supple. No pain with movement, spinous process tenderness or muscular tenderness. Normal range of motion.     Right  lower leg: No edema.     Left lower leg: No edema.  Skin:    General: Skin is warm and dry.     Capillary Refill: Capillary refill takes less than 2 seconds.     Findings: No ecchymosis, erythema, lesion or wound.  Neurological:     Mental Status: He is alert and oriented to person, place, and time.     GCS: GCS eye subscore is 4. GCS verbal subscore is 5. GCS motor subscore is 6.     Cranial Nerves: Cranial nerves 2-12 are intact.     Sensory: Sensation is intact.     Motor: Motor function is intact. No weakness or abnormal muscle tone.     Coordination: Coordination is intact.  Psychiatric:        Mood and Affect: Mood normal.        Speech: Speech normal.        Behavior: Behavior normal.     ED Results / Procedures /  Treatments   Labs (all labs ordered are listed, but only abnormal results are displayed) Labs Reviewed - No data to display  EKG None  Radiology No results found.  Procedures Procedures    Medications Ordered in ED Medications - No data to display  ED Course/ Medical Decision Making/ A&P                                 Medical Decision Making  Presents to the emergency department for evaluation of urinary retention secondary to hematuria and blood clots.  Catheter will be replaced, bladder irrigated.  He can follow-up with urology.        Final Clinical Impression(s) / ED Diagnoses Final diagnoses:  Gross hematuria  Urinary retention    Rx / DC Orders ED Discharge Orders     None         Adely Facer, Marine Sia, MD 05/27/23 2343

## 2023-05-27 NOTE — ED Triage Notes (Signed)
 Here two weeks ago and a cath was placed and today it was taken out by urologist. Patient is unable to urinate and feels pressure today. Patient stated that he has passed 3-4 clots this morning around 11 am about the half of a quarter size.

## 2023-05-28 DIAGNOSIS — R310 Gross hematuria: Secondary | ICD-10-CM | POA: Diagnosis not present

## 2023-05-30 ENCOUNTER — Ambulatory Visit (HOSPITAL_COMMUNITY)
Admission: RE | Admit: 2023-05-30 | Discharge: 2023-05-30 | Disposition: A | Discharge status: Home / Self Care | Source: Ambulatory Visit | Attending: Urology | Admitting: Urology

## 2023-05-30 ENCOUNTER — Inpatient Hospital Stay (HOSPITAL_BASED_OUTPATIENT_CLINIC_OR_DEPARTMENT_OTHER): Admitting: Certified Registered Nurse Anesthetist

## 2023-05-30 ENCOUNTER — Other Ambulatory Visit: Payer: Self-pay | Admitting: Urology

## 2023-05-30 ENCOUNTER — Encounter (HOSPITAL_COMMUNITY)
Admission: RE | Disposition: A | Discharge status: Home / Self Care | Payer: Self-pay | Source: Ambulatory Visit | Attending: Urology

## 2023-05-30 ENCOUNTER — Encounter (HOSPITAL_COMMUNITY): Payer: Self-pay | Admitting: Urology

## 2023-05-30 ENCOUNTER — Inpatient Hospital Stay (HOSPITAL_COMMUNITY): Admitting: Certified Registered Nurse Anesthetist

## 2023-05-30 ENCOUNTER — Other Ambulatory Visit: Payer: Self-pay

## 2023-05-30 DIAGNOSIS — N3041 Irradiation cystitis with hematuria: Secondary | ICD-10-CM | POA: Insufficient documentation

## 2023-05-30 DIAGNOSIS — N3040 Irradiation cystitis without hematuria: Secondary | ICD-10-CM | POA: Diagnosis not present

## 2023-05-30 DIAGNOSIS — R310 Gross hematuria: Secondary | ICD-10-CM

## 2023-05-30 DIAGNOSIS — N3091 Cystitis, unspecified with hematuria: Secondary | ICD-10-CM | POA: Diagnosis not present

## 2023-05-30 DIAGNOSIS — I2510 Atherosclerotic heart disease of native coronary artery without angina pectoris: Secondary | ICD-10-CM | POA: Diagnosis not present

## 2023-05-30 DIAGNOSIS — Z8546 Personal history of malignant neoplasm of prostate: Secondary | ICD-10-CM | POA: Insufficient documentation

## 2023-05-30 DIAGNOSIS — Z87891 Personal history of nicotine dependence: Secondary | ICD-10-CM | POA: Diagnosis not present

## 2023-05-30 DIAGNOSIS — E785 Hyperlipidemia, unspecified: Secondary | ICD-10-CM

## 2023-05-30 DIAGNOSIS — K219 Gastro-esophageal reflux disease without esophagitis: Secondary | ICD-10-CM | POA: Insufficient documentation

## 2023-05-30 DIAGNOSIS — K449 Diaphragmatic hernia without obstruction or gangrene: Secondary | ICD-10-CM | POA: Insufficient documentation

## 2023-05-30 DIAGNOSIS — Z01818 Encounter for other preprocedural examination: Principal | ICD-10-CM

## 2023-05-30 DIAGNOSIS — C61 Malignant neoplasm of prostate: Secondary | ICD-10-CM

## 2023-05-30 DIAGNOSIS — D494 Neoplasm of unspecified behavior of bladder: Secondary | ICD-10-CM | POA: Diagnosis not present

## 2023-05-30 SURGERY — TURBT (TRANSURETHRAL RESECTION OF BLADDER TUMOR)
Anesthesia: General

## 2023-05-30 MED ORDER — TRANEXAMIC ACID-NACL 1000-0.7 MG/100ML-% IV SOLN
INTRAVENOUS | Status: DC | PRN
  Administered 2023-05-30: 1000 mg via INTRAVENOUS

## 2023-05-30 MED ORDER — LACTATED RINGERS IV SOLN
INTRAVENOUS | Status: DC
Start: 2023-05-30 — End: 2023-05-31

## 2023-05-30 MED ORDER — LIDOCAINE HCL (PF) 2 % IJ SOLN
INTRAMUSCULAR | Status: AC
  Filled 2023-05-30: qty 5

## 2023-05-30 MED ORDER — HYDROMORPHONE HCL 1 MG/ML IJ SOLN: 0.2500 mg | INTRAMUSCULAR | Status: DC | PRN

## 2023-05-30 MED ORDER — SUGAMMADEX SODIUM 200 MG/2ML IV SOLN
INTRAVENOUS | Status: DC | PRN
  Administered 2023-05-30: 250 mg via INTRAVENOUS

## 2023-05-30 MED ORDER — OXYCODONE HCL 5 MG PO TABS
5.0000 mg | ORAL_TABLET | Freq: Once | ORAL | Status: AC | PRN
  Administered 2023-05-30: 5 mg via ORAL

## 2023-05-30 MED ORDER — SODIUM CHLORIDE 0.9 % IR SOLN
Status: DC | PRN
  Administered 2023-05-30 (×2): 3000 mL via INTRAVESICAL

## 2023-05-30 MED ORDER — DEXAMETHASONE SODIUM PHOSPHATE 10 MG/ML IJ SOLN
INTRAMUSCULAR | Status: AC
  Filled 2023-05-30: qty 1

## 2023-05-30 MED ORDER — OXYCODONE HCL 5 MG PO TABS
ORAL_TABLET | ORAL | Status: AC
  Filled 2023-05-30: qty 1

## 2023-05-30 MED ORDER — ROCURONIUM BROMIDE 10 MG/ML (PF) SYRINGE
PREFILLED_SYRINGE | INTRAVENOUS | Status: DC | PRN
  Administered 2023-05-30: 30 mg via INTRAVENOUS

## 2023-05-30 MED ORDER — ONDANSETRON HCL 4 MG/2ML IJ SOLN
INTRAMUSCULAR | Status: AC
Start: 2023-05-30 — End: ?
  Filled 2023-05-30: qty 2

## 2023-05-30 MED ORDER — AMISULPRIDE (ANTIEMETIC) 5 MG/2ML IV SOLN: 10.0000 mg | Freq: Once | INTRAVENOUS | Status: DC | PRN

## 2023-05-30 MED ORDER — ONDANSETRON HCL 4 MG/2ML IJ SOLN
INTRAMUSCULAR | Status: DC | PRN
Start: 2023-05-30 — End: 2023-05-30
  Administered 2023-05-30: 4 mg via INTRAVENOUS

## 2023-05-30 MED ORDER — FENTANYL CITRATE (PF) 100 MCG/2ML IJ SOLN
INTRAMUSCULAR | Status: DC | PRN
Start: 2023-05-30 — End: 2023-05-30
  Administered 2023-05-30 (×2): 25 ug via INTRAVENOUS
  Administered 2023-05-30: 50 ug via INTRAVENOUS

## 2023-05-30 MED ORDER — PROPOFOL 10 MG/ML IV BOLUS
INTRAVENOUS | Status: AC
  Filled 2023-05-30: qty 20

## 2023-05-30 MED ORDER — SODIUM CHLORIDE 0.9% FLUSH: 3.0000 mL | Freq: Two times a day (BID) | INTRAVENOUS | Status: DC

## 2023-05-30 MED ORDER — PROPOFOL 10 MG/ML IV BOLUS
INTRAVENOUS | Status: DC | PRN
  Administered 2023-05-30: 20 mg via INTRAVENOUS
  Administered 2023-05-30: 30 mg via INTRAVENOUS
  Administered 2023-05-30: 150 mg via INTRAVENOUS

## 2023-05-30 MED ORDER — OXYCODONE HCL 5 MG/5ML PO SOLN: 5.0000 mg | Freq: Once | ORAL | Status: AC | PRN

## 2023-05-30 MED ORDER — SODIUM CHLORIDE 0.9 % IV SOLN: 12.5000 mg | INTRAVENOUS | Status: DC | PRN

## 2023-05-30 MED ORDER — FENTANYL CITRATE (PF) 100 MCG/2ML IJ SOLN
INTRAMUSCULAR | Status: AC
  Filled 2023-05-30: qty 2

## 2023-05-30 MED ORDER — ROCURONIUM BROMIDE 10 MG/ML (PF) SYRINGE
PREFILLED_SYRINGE | INTRAVENOUS | Status: AC
  Filled 2023-05-30: qty 10

## 2023-05-30 MED ORDER — ORAL CARE MOUTH RINSE: 15.0000 mL | Freq: Once | OROMUCOSAL | Status: AC

## 2023-05-30 MED ORDER — CHLORHEXIDINE GLUCONATE 0.12 % MT SOLN
15.0000 mL | Freq: Once | OROMUCOSAL | Status: AC
  Administered 2023-05-30: 15 mL via OROMUCOSAL

## 2023-05-30 MED ORDER — SUCCINYLCHOLINE CHLORIDE 200 MG/10ML IV SOSY
PREFILLED_SYRINGE | INTRAVENOUS | Status: DC | PRN
Start: 2023-05-30 — End: 2023-05-30
  Administered 2023-05-30: 140 mg via INTRAVENOUS

## 2023-05-30 MED ORDER — LIDOCAINE HCL (CARDIAC) PF 100 MG/5ML IV SOSY
PREFILLED_SYRINGE | INTRAVENOUS | Status: DC | PRN
  Administered 2023-05-30: 100 mg via INTRAVENOUS

## 2023-05-30 MED ORDER — CEFAZOLIN SODIUM-DEXTROSE 2-4 GM/100ML-% IV SOLN
2.0000 g | INTRAVENOUS | Status: AC
  Administered 2023-05-30: 2 g via INTRAVENOUS
  Filled 2023-05-30: qty 100

## 2023-05-30 SURGICAL SUPPLY — 20 items
BAG URINE DRAIN 2000ML AR STRL (UROLOGICAL SUPPLIES) IMPLANT
BAG URINE LEG 500ML (DRAIN) IMPLANT
BAG URO CATCHER STRL LF (MISCELLANEOUS) ×1 IMPLANT
CATH FOLEY 2WAY 5CC 20FR (CATHETERS) IMPLANT
CATH FOLEY 3WAY 30CC 22FR (CATHETERS) IMPLANT
CATH URETL OPEN 5X70 (CATHETERS) IMPLANT
DRAPE FOOT SWITCH (DRAPES) ×1 IMPLANT
ELECT REM PT RETURN 15FT ADLT (MISCELLANEOUS) ×1 IMPLANT
GLOVE SURG SS PI 8.0 STRL IVOR (GLOVE) ×1 IMPLANT
GOWN STRL SURGICAL XL XLNG (GOWN DISPOSABLE) ×1 IMPLANT
HOLDER FOLEY CATH W/STRAP (MISCELLANEOUS) IMPLANT
KIT TURNOVER KIT A (KITS) IMPLANT
LOOP CUT BIPOLAR 24F LRG (ELECTROSURGICAL) IMPLANT
MANIFOLD NEPTUNE II (INSTRUMENTS) ×1 IMPLANT
PACK CYSTO (CUSTOM PROCEDURE TRAY) ×1 IMPLANT
SUT ETHILON 3 0 PS 1 (SUTURE) IMPLANT
SYR 30ML LL (SYRINGE) IMPLANT
SYRINGE TOOMEY IRRIG 70ML (MISCELLANEOUS) IMPLANT
TUBING CONNECTING 10 (TUBING) ×1 IMPLANT
TUBING UROLOGY SET (TUBING) ×1 IMPLANT

## 2023-05-30 NOTE — Anesthesia Procedure Notes (Signed)
 Procedure Name: Intubation Date/Time: 05/30/2023 5:03 PM  Performed by: Rochell Chroman, CRNAPre-anesthesia Checklist: Patient identified, Emergency Drugs available, Suction available and Patient being monitored Patient Re-evaluated:Patient Re-evaluated prior to induction Oxygen Delivery Method: Circle system utilized Preoxygenation: Pre-oxygenation with 100% oxygen Induction Type: IV induction Ventilation: Mask ventilation without difficulty Laryngoscope Size: 2 and Miller Grade View: Grade II Tube type: Oral Tube size: 7.5 mm Number of attempts: 1 Airway Equipment and Method: Stylet Placement Confirmation: ETT inserted through vocal cords under direct vision, positive ETCO2 and breath sounds checked- equal and bilateral Secured at: 23 cm Tube secured with: Tape Dental Injury: Teeth and Oropharynx as per pre-operative assessment

## 2023-05-30 NOTE — Transfer of Care (Signed)
 Immediate Anesthesia Transfer of Care Note  Patient: Allen Cervantes  Procedure(s) Performed: TURBT (TRANSURETHRAL RESECTION OF BLADDER TUMOR) CYSTOSCOPY, WITH BLADDER FULGURATION  Patient Location: PACU  Anesthesia Type:General  Level of Consciousness: awake, alert , and oriented  Airway & Oxygen Therapy: Patient Spontanous Breathing  Post-op Assessment: Report given to RN and Post -op Vital signs reviewed and stable  Post vital signs: Reviewed and stable  Last Vitals:  Vitals Value Taken Time  BP 162/85 05/30/23 1740  Temp    Pulse 57 05/30/23 1742  Resp 16 05/30/23 1742  SpO2 95 % 05/30/23 1742  Vitals shown include unfiled device data.  Last Pain:  Vitals:   05/30/23 1542  TempSrc: Oral  PainSc: 6          Complications: No notable events documented.

## 2023-05-30 NOTE — Op Note (Signed)
 Procedure: 1.  Cystoscopy with evacuation of clot and fulguration of bleeders. 2.  Transurethral resection of 2 cm left lateral bladder wall lesion.  Preop diagnosis: Gross hematuria with radiation cystitis.  Postop diagnosis: Same.  Surgeon: Dr. Homero Luster.  Anesthesia: General.  Specimen: Bladder lesion chips.  Drain: 20 [place] Foley catheter.  EBL: None.  Complications: None.  Indications: The patient is an 83 year old male with a history of prostate radiation with brachytherapy in September 2022.  He has had recent gross hematuria with clots and had a questionable lesion on the left bladder wall on cystoscopy in the office.  He was to be scheduled for surgery more electively but the bleeding has persisted and he is having clots passed in his catheter bag.  Procedure: He was taken the operating room where general anesthetic was induced and he was intubated.  He was given 2 g of Ancef .  He he was given TXA.  He was placed in lithotomy position and fitted with PAS hose.  His perineum and genitalia were prepped with Betadine solution he was draped in usual sterile fashion.  Cystoscopy was performed using the 21 [place] scope and 30 degree lens.  Examination demonstrated a normal urethra.  The external sphincter was intact.  There was adherent clot in the prostatic urethra with erythema of the prostatic mucosa.  Examination the bladder demonstrated bloody urine that required irrigation with removal of few clots.  There was patchy erythema on the bladder wall consistent with radiation cystitis but the most significant area was on the left lateral wall which was somewhat edematous and well most likely radiation cystitis and neoplasm could not be completely ruled out.  Ureteral orifices were unremarkable and away from the areas of concern.  Once cystoscopy was complete a 26 French continuous-flow resectoscope sheath was placed with the aid of visual obturator.  It was then fitted with an Anette Barb  handle with a bipolar loop and a 30 degree lens with saline uses the irrigant.  A paralytic was given because the location of the lesion on the lateral wall.  Initially the clot in the prostatic urethra was removed and he was noted to have oozing from primarily the right lateral lobe of the prostate from the Vero proximally.  This was generously fulgurated to provide hemostasis and there was some additional oozing on the left side more apically which was fulgurated.  Once the prostatic bleeding had been controlled the lesion on the left lateral wall of the bladder was resected and the edematous hemorrhagic mucosa appeared to on fibrotic desmoplastic layer of tissue beneath.  The resection site was then generously fulgurated and once hemostasis is achieved the tissue was removed to be sent to pathology.  Final inspection demonstrated no bladder wall perforation and no active bleeding.  The resectoscope was removed and a 20 French Foley catheter was inserted.  The balloon was filled with 10 mL of sterile fluid.  The cath was placed to straight drainage.  He was taken down from lithotomy position, his anesthetic was reversed and he was moved recovery room in stable condition.  There were no complications.

## 2023-05-30 NOTE — Interval H&P Note (Signed)
 History and Physical Interval Note:  05/30/2023 3:56 PM  Allen Cervantes  has presented today for surgery, with the diagnosis of HEMATURIA.  The various methods of treatment have been discussed with the patient and family. After consideration of risks, benefits and other options for treatment, the patient has consented to  Procedure(s): TURBT (TRANSURETHRAL RESECTION OF BLADDER TUMOR) (N/A) CYSTOSCOPY, WITH BLADDER FULGURATION (N/A) as a surgical intervention.  The patient's history has been reviewed, patient examined, no change in status, stable for surgery.  I have reviewed the patient's chart and labs.  Questions were answered to the patient's satisfaction.     [name]

## 2023-05-30 NOTE — H&P (Signed)
 02/01/2020  Most recent PSA on 01/19/2020 was 14.58. Last PSA value was up to 5.8 back in 2019. As mentioned above, he had a negative prostate biopsy back in November of 2019. He has a little bit increase in frequency since I last saw him. Otherwise no voiding complaints. He drinks 3 cups of coffee in the morning.   03/29/2020  MRI revealed a PIRADS 5 lesion. over the past 2 weeks, he has had an increase in urinary frequency and urgency. He also has some pain and discomfort when he voids.   06/19/2020  Patient doing well since prostate biopsy. Biopsy revealed a 30 g prostate. Unfortunately, pathology results were positive for adenocarcinoma the prostate in 5/12 cores. All cores located on the left. Maximum Gleason score was 4+5. He underwent a CT scan that revealed no evidence of metastasis and a bone scan that also revealed no evidence of metastasis.   07/20/2020: Back today for f/u exam. He has since seen Dr Lorri Rota for consult. Per his note, the patient was leaning toward brachytherapy with external beam radiation as well but wanted to delay the treatment until he returns from a family trip in August of this year. Back today for follow-up visit to initiate ADT with Firmagon . He is already scheduled for a preprocedure appointment with radiation oncology in late August. Overall patient doing well. He continues tamsulosin with noted stability of baseline lower urinary tract symptoms. Denies significantly bothersome daytime voiding symptoms. No changes in force of stream, dysuria or gross hematuria since time of last office visit. He has nocturia 1, occasionally twice at night. No recent fevers or chills, nausea/vomiting. Denies any unexplained weight loss, new unexplained bone pain, new unexplained fatigue.   08/23/2020  Patient received Firmagon  about 1 month ago. PSA is 4.56, testosterone  is castrate. He returns for 3 month Eligard  injection. He has elected for brachytherapy boost followed by external  beam radiation. He is scheduled for this in September.3   03/01/2021  Patient has completed EBRT and brachytherapy. He remains on ADT. PSA 0.022. Testosterone  castrate. He is due for Eligard  today.   05/29/2021: 30-month visit today. PSA now at an undetectable level. Testosterone  less than 10. Due for Eligard  today as well. Continues to do well. Voiding symptoms grossly stable with him continuing tamsulosin. Symptom score sheet remains low. No interval dysuria or gross hematuria. Denies constipation, diarrhea or blood in the stool. Not having any excessive fatigue or hot flashes with him continuing Eligard .   09/27/2021  PSA remains undetectable. Has decreased sex drive but otherwise tolerating ADT very well. Continues to void well on tamsulosin. No complaints.   01/04/2022: Returns today for follow-up exam. PSA remains undetectable and testosterone  at castrate level as he continues on ADT with 42-month Eligard . Due for Eligard  injection today. Patient also continues tamsulosin for management of his underlying lower urinary tract symptoms. Voiding symptoms remain stable. He voids with a good flow, no dysuria or gross hematuria in the interval. Nocturia stable 1-2 times nightly. Outside of some decreased libido he continues to tolerate ADT well.   05/05/2021  Patient been doing well. Started ADT June 2022. PSA remains undetectable and testosterone  castrate. Presents for 28-month Eligard . This will be his last Eligard  injection.   06/06/2022: Patient with above-noted history, seen today for an acute visit due to persistent dysuria for the last several weeks. Current symptoms began about 3 weeks ago with moderately severe dysuria encounter with each void. He remains on tamsulosin denying any changes in force  of stream or increased frequency/urgency from baseline. Denies any gross hematuria. Over the past week the dysuria has been decreasing in severity by his report. UA today within normal limits.    09/10/2022: Patient seen today for evaluation of gross hematuria. Symptoms began last week with some passage of some small clots and difficulty voiding but that has improved. Also with some mild burning and pain with urination. He sought treatment at a local urgent care where he was staying in the [facility], he was empirically treated with Cipro  for 14 days. Urine culture ultimately resulted negative. As of this afternoon's appointment, he has not seen any additional gross hematuria and is back to baseline voiding without significant difficulty. Past GU hx of PCa treated with XRT and seed boost as well as ADT. Not on any type of anticoagulation treatment.   10/23/2022  Patient underwent a CT hematuria protocol that revealed a 15 mm benign hemorrhagic cyst in the upper left kidney. No concerning renal masses. No evidence of genitourinary malignancy or stone. He still has some intermittent dysuria. Seems to be improving from prior. No further gross hematuria.   03/31/2023  PSA remains undetectable. Testosterone  remains low at 16.5. He declines cystoscopy for completion of hematuria workup. He is having some fatigue and low drive. He wanted to talk about testosterone  replacement given that his testosterone  has not yet rebounded over the course of the past couple of years.   05/20/2023: 83 year old man with a past medical history of prostate cancer. He was treated with brachytherapy and ADT. He had a CT scan in September 2024 which revealed a 15 mm benign hemorrhagic cyst in his left upper kidney. He declined a cystoscopy in March. Approximately 10 days ago, he developed gross hematuria with inability to void. He was seen in the emergency department and a Foley catheter was placed. His urine showed mostly blood but few bacteria. Culture was negative. He presents today for potential trial of void however, he would like to see Dr. Parke Boll first prior to proceeding with a voiding trial. Encouraged him to let me  perform a renal ultrasound to check for tract since he had a recent CT hematuria as well as to ensure there was not a large clot burden in the bladder. He was agreeable to this.   05/27/2023  Patient continues to have some intermittent gross hematuria with the catheter. However, catheter has been draining well. Presents for cystoscopy. Renal ultrasound at last visit showed some benign cyst.   05/30/23: Allen Cervantes returns today in f/u for his recent history of hematuria that began 2 weeks ago with clot retention requiring an ER visit. He had had to have it flushed. He had a CT that showed no bladder clots. He saw Dr. Parke Boll on 4/29 and had cystoscopy and was found to have some changes in the prostate and bladder wall and he was scheduled to have cystoscopy with possible TURBT. He voided after the cystoscopy and he went home but had to go to the OR and had a foley replaced. He continues to have daily bleeding. The catheter is draining and he hasn't had any clots but the urine is dark.     ALLERGIES: No Allergies    MEDICATIONS: Hydrochlorothiazide  Tamsulosin HCl 0.4 MG Capsule 1 capsule PO Daily  Pantoprazole Sodium  Rosuvastatin Calcium  Testosterone  50 MG/5GM (1%) Gel 1 pack Topical Daily     Notes: He hasn't started the testosterone  yet.    GU PSH: Cystoscopy - 05/27/2023 Locm  300-399Mg /Ml Iodine,1Ml - 09/24/2022, 2022 Prostate Needle Biopsy - 2022, 2019 TRANSPERI NEEDLE PLACE, PROS - 2022 Transperineal Plmt Biodegradable Matrl 1/Mlt Njx - 2022       PSH Notes: retina surgery   NON-GU PSH: Appendectomy - 1952 Surgical Pathology, Gross And Microscopic Examination For Prostate Needle - 2022, 2019 Visit Complexity (formerly GPC1X) - 05/20/2023, 03/31/2023, 10/23/2022, 09/10/2022, 05/06/2022     GU PMH: Gross hematuria - 05/27/2023, - 05/20/2023, - 03/31/2023, - 10/23/2022, - 09/24/2022, - 09/10/2022 Prostate Cancer - 05/27/2023, - 03/31/2023, - 10/23/2022, - 06/06/2022, - 05/06/2022, - 01/04/2022, - 09/27/2021, -  2023, - 2023, - 2022, - 2022, - 2022, - 2022, - 2022, - 2022 Urinary Retention - 05/20/2023 Primary hypogonadism - 03/31/2023 Dysuria (Stable) - 10/23/2022, - 06/06/2022, - 2022 Nocturia - 01/04/2022, - 09/27/2021 BPH w/LUTS - 2022, - 2022, - 2022 Elevated PSA - 2022, - 2022, - 2022, - 2022, - 2019, - 2019, - 2019, - 2019 Urinary Frequency - 2022, (Stable), - 2022, - 2022 Acute prostatitis - 2022, - 2019 BPH w/o LUTS - 2019 Acute Cystitis/UTI - 2019      PMH Notes: HLD   NON-GU PMH: GERD Glaucoma    FAMILY HISTORY: Cancer - Sister Heart Attack - Father   SOCIAL HISTORY: Marital Status: Married Preferred Language: English; Ethnicity: Not Hispanic Or Latino; Race: White Current Smoking Status: Patient does not smoke anymore. Has not smoked since 08/29/1967. Smoked 1 pack per day.   Tobacco Use Assessment Completed: Used Tobacco in last 30 days? Does not use smokeless tobacco. Drinks 2 drinks per day. Types of alcohol consumed: Wine.  Does not use drugs. Drinks 2 caffeinated drinks per day. Has not had a blood transfusion. 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     Notes: ETOH wine couple of glasses every other night    REVIEW OF SYSTEMS:    GU Review Male:   Patient denies frequent urination, hard to postpone urination, burning/ pain with urination, get up at night to urinate, leakage of urine, stream starts and stops, trouble starting your stream, have to strain to urinate , erection problems, and penile pain.  Gastrointestinal (Upper):   Patient denies nausea, vomiting, and indigestion/ heartburn.  Gastrointestinal (Lower):   Patient denies diarrhea and constipation.  Constitutional:   Patient denies fever, night sweats, weight loss, and fatigue.  Skin:   Patient denies skin rash/ lesion and itching.  Eyes:   Patient denies blurred vision and double vision.  Ears/ Nose/ Throat:   Patient denies sore throat and sinus problems.  Hematologic/Lymphatic:   Patient denies swollen  glands and easy bruising.  Cardiovascular:   Patient denies leg swelling and chest pains.  Respiratory:   Patient denies cough and shortness of breath.  Endocrine:   Patient denies excessive thirst.  Musculoskeletal:   Patient denies back pain and joint pain.  Neurological:   Patient denies headaches and dizziness.  Psychologic:   Patient denies depression and anxiety.   VITAL SIGNS:      05/30/2023 09:30 AM  Weight 249 lb / 112.94 kg  Height 74 in / 187.96 cm  BP 126/72 mmHg  Heart Rate 58 /min  Temperature 97.7 F / 36.5 C  BMI 32.0 kg/m   MULTI-SYSTEM PHYSICAL EXAMINATION:    Constitutional: Well-nourished. No physical deformities. Normally developed. Good grooming.  Respiratory: Normal breath sounds. No labored breathing, no use of accessory muscles.   Cardiovascular: Regular rate and rhythm. No murmur, no gallop.      Complexity of  Data:  Records Review:   Previous Patient Records   03/24/23 10/23/22 04/09/22 12/28/21 09/20/21 05/22/21 02/22/21 11/20/20  PSA  Total PSA <0.015 ng/mL 0.015 ng/mL <0.015 ng/mL <0.015 ng/mL <0.015 ng/mL <0.015 ng/mL 0.022 ng/mL 0.69 ng/mL    03/24/23 10/23/22 04/09/22 12/28/21 09/20/21 05/22/21 02/22/21 11/20/20  Hormones  Testosterone , Total 16.5 ng/dL <16 ng/dL <10 ng/dL <96 ng/dL <04 ng/dL <54 ng/dL <09 ng/dL <81 ng/dL    PROCEDURES:          Visit Complexity - G2211 Chronic management   ASSESSMENT:      ICD-10 Details  1 GU:   Gross hematuria - R31.0 Acute, Threat to Bodily Function - He has persistent gross hematuria. I discussed options and will take him to the OR this evening for cystosocpy with possible TURBT and fulguration. Risks reviewed in detail.   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            Schedule Return Visit/Planned  Activity: ASAP - Schedule Surgery  Procedure: Unspecified Date - Cystoscopy TURBT <2 cm - 19147 Notes: today          Document Letter(s):  Created for Patient: Clinical Summary         Next Appointment:      Next Appointment: 06/06/2023 01:00 PM    Appointment Type: Postoperative Appointment    Location: [facility] [phone]    Provider: Raymon Caldron, NP    Reason for Visit: PO--FULG BLEEDERS

## 2023-05-30 NOTE — Anesthesia Postprocedure Evaluation (Signed)
 Anesthesia Post Note  Patient: HALIL SLIMP  Procedure(s) Performed: TURBT (TRANSURETHRAL RESECTION OF BLADDER TUMOR) CYSTOSCOPY, WITH BLADDER FULGURATION     Patient location during evaluation: PACU Anesthesia Type: General Level of consciousness: awake and alert Pain management: pain level controlled Vital Signs Assessment: post-procedure vital signs reviewed and stable Respiratory status: spontaneous breathing, nonlabored ventilation and respiratory function stable Cardiovascular status: blood pressure returned to baseline and stable Postop Assessment: no apparent nausea or vomiting Anesthetic complications: no   No notable events documented.  Last Vitals:  Vitals:   05/30/23 1816 05/30/23 1830  BP: (!) 141/63 (!) 143/88  Pulse: (!) 47 (!) 54  Resp: 13 13  Temp: 36.7 C 36.7 C  SpO2: 95% 95%    Last Pain:  Vitals:   05/30/23 1815  TempSrc:   PainSc: 1                  Earvin Goldberg

## 2023-05-30 NOTE — Anesthesia Preprocedure Evaluation (Signed)
 Anesthesia Evaluation  Patient identified by MRN, date of birth, ID band Patient awake    Reviewed: Allergy & Precautions, NPO status , Patient's Chart, lab work & pertinent test results, reviewed documented beta blocker date and time   Airway Mallampati: III  TM Distance: >3 FB Neck ROM: Full    Dental  (+) Teeth Intact, Caps, Dental Advisory Given   Pulmonary former smoker   Pulmonary exam normal breath sounds clear to auscultation       Cardiovascular + CAD  Normal cardiovascular exam Rhythm:Regular Rate:Normal  Nuclear stress test   03-15-2020, low risk, normal perfusion , normal lv function and wall motion ,nuclear ef 58%   Neuro/Psych Glaucoma ou  negative psych ROS   GI/Hepatic Neg liver ROS, hiatal hernia,GERD  Medicated and Controlled,,Hx/o Barrett's esophagus   Endo/Other  Obesity Hyperlipidemia  Renal/GU negative Renal ROS   Prostate Ca    Musculoskeletal negative musculoskeletal ROS (+)    Abdominal  (+) + obese  Peds  Hematology negative hematology ROS (+)   Anesthesia Other Findings   Reproductive/Obstetrics                             Anesthesia Physical Anesthesia Plan  ASA: 3  Anesthesia Plan: General   Post-op Pain Management:    Induction: Intravenous  PONV Risk Score and Plan: 2 and Treatment may vary due to age or medical condition, Ondansetron  and Midazolam   Airway Management Planned: LMA  Additional Equipment:   Intra-op Plan:   Post-operative Plan: Extubation in OR  Informed Consent: I have reviewed the patients History and Physical, chart, labs and discussed the procedure including the risks, benefits and alternatives for the proposed anesthesia with the patient or authorized representative who has indicated his/her understanding and acceptance.     Dental advisory given  Plan Discussed with: CRNA and Anesthesiologist  Anesthesia Plan  Comments:         Anesthesia Quick Evaluation

## 2023-05-30 NOTE — Discharge Instructions (Addendum)
 Please hold the aspirin for another week and avoid NSAID's like aleve  or ibuprofen .  Tylenol  is ok.     You may remove the catheter on Monday morning.

## 2023-05-31 ENCOUNTER — Encounter (HOSPITAL_COMMUNITY): Payer: Self-pay | Admitting: Urology

## 2023-06-02 DIAGNOSIS — R338 Other retention of urine: Secondary | ICD-10-CM | POA: Diagnosis not present

## 2023-06-02 DIAGNOSIS — N3041 Irradiation cystitis with hematuria: Secondary | ICD-10-CM | POA: Diagnosis not present

## 2023-06-03 LAB — SURGICAL PATHOLOGY

## 2023-06-05 ENCOUNTER — Other Ambulatory Visit: Payer: Self-pay

## 2023-06-05 ENCOUNTER — Emergency Department (HOSPITAL_COMMUNITY)
Admission: EM | Admit: 2023-06-05 | Discharge: 2023-06-05 | Disposition: A | Discharge status: Home / Self Care | Attending: Emergency Medicine | Admitting: Emergency Medicine

## 2023-06-05 ENCOUNTER — Encounter (HOSPITAL_COMMUNITY): Payer: Self-pay

## 2023-06-05 DIAGNOSIS — D72829 Elevated white blood cell count, unspecified: Secondary | ICD-10-CM | POA: Diagnosis not present

## 2023-06-05 DIAGNOSIS — R338 Other retention of urine: Secondary | ICD-10-CM | POA: Diagnosis not present

## 2023-06-05 DIAGNOSIS — N3001 Acute cystitis with hematuria: Secondary | ICD-10-CM | POA: Insufficient documentation

## 2023-06-05 DIAGNOSIS — Z8546 Personal history of malignant neoplasm of prostate: Secondary | ICD-10-CM | POA: Insufficient documentation

## 2023-06-05 DIAGNOSIS — R7989 Other specified abnormal findings of blood chemistry: Secondary | ICD-10-CM | POA: Insufficient documentation

## 2023-06-05 DIAGNOSIS — Z7982 Long term (current) use of aspirin: Secondary | ICD-10-CM | POA: Insufficient documentation

## 2023-06-05 DIAGNOSIS — R339 Retention of urine, unspecified: Secondary | ICD-10-CM | POA: Diagnosis not present

## 2023-06-05 DIAGNOSIS — N3041 Irradiation cystitis with hematuria: Secondary | ICD-10-CM | POA: Diagnosis not present

## 2023-06-05 LAB — URINALYSIS, ROUTINE W REFLEX MICROSCOPIC
Bilirubin Urine: NEGATIVE
Glucose, UA: NEGATIVE mg/dL
Ketones, ur: NEGATIVE mg/dL
Nitrite: NEGATIVE
Protein, ur: 100 mg/dL — AB
RBC / HPF: >50 RBC/hpf (ref 0–5)
Specific Gravity, Urine: 1.016 (ref 1.005–1.030)
WBC, UA: >50 WBC/hpf (ref 0–5)
pH: 6 (ref 5.0–8.0)

## 2023-06-05 LAB — CBC WITH DIFFERENTIAL/PLATELET
Abs Immature Granulocytes: 0.33 10*3/uL — ABNORMAL HIGH (ref 0.00–0.07)
Basophils Absolute: 0.1 10*3/uL (ref 0.0–0.1)
Basophils Relative: 1 %
Eosinophils Absolute: 0.1 10*3/uL (ref 0.0–0.5)
Eosinophils Relative: 1 %
HCT: 35.8 % — ABNORMAL LOW (ref 39.0–52.0)
Hemoglobin: 12.2 g/dL — ABNORMAL LOW (ref 13.0–17.0)
Immature Granulocytes: 3 %
Lymphocytes Relative: 9 %
Lymphs Abs: 1.1 10*3/uL (ref 0.7–4.0)
MCH: 33.6 pg (ref 26.0–34.0)
MCHC: 34.1 g/dL (ref 30.0–36.0)
MCV: 98.6 fL (ref 80.0–100.0)
Monocytes Absolute: 1.4 10*3/uL — ABNORMAL HIGH (ref 0.1–1.0)
Monocytes Relative: 11 %
Neutro Abs: 9.4 10*3/uL — ABNORMAL HIGH (ref 1.7–7.7)
Neutrophils Relative %: 75 %
Platelets: 220 10*3/uL (ref 150–400)
RBC: 3.63 MIL/uL — ABNORMAL LOW (ref 4.22–5.81)
RDW: 14.3 % (ref 11.5–15.5)
WBC: 12.5 10*3/uL — ABNORMAL HIGH (ref 4.0–10.5)
nRBC: 0 % (ref 0.0–0.2)

## 2023-06-05 LAB — BASIC METABOLIC PANEL WITH GFR
Anion gap: 10 (ref 5–15)
BUN: 26 mg/dL — ABNORMAL HIGH (ref 8–23)
CO2: 27 mmol/L (ref 22–32)
Calcium: 9.2 mg/dL (ref 8.9–10.3)
Chloride: 100 mmol/L (ref 98–111)
Creatinine, Ser: 1.37 mg/dL — ABNORMAL HIGH (ref 0.61–1.24)
GFR, Estimated: 52 mL/min — ABNORMAL LOW (ref >60)
Glucose, Bld: 115 mg/dL — ABNORMAL HIGH (ref 70–99)
Potassium: 3.6 mmol/L (ref 3.5–5.1)
Sodium: 137 mmol/L (ref 135–145)

## 2023-06-05 MED ORDER — CEPHALEXIN 500 MG PO CAPS
500.0000 mg | ORAL_CAPSULE | Freq: Once | ORAL | Status: AC
  Administered 2023-06-05: 500 mg via ORAL
  Filled 2023-06-05: qty 1

## 2023-06-05 MED ORDER — CEPHALEXIN 500 MG PO CAPS: 500.0000 mg | ORAL_CAPSULE | Freq: Two times a day (BID) | ORAL | 0 refills | Status: AC

## 2023-06-05 NOTE — ED Notes (Signed)
 ER Staff unable to obtain accurate ready with bladder scanner due to technical malfunction.

## 2023-06-05 NOTE — ED Provider Triage Note (Signed)
 Emergency Medicine Provider Triage Evaluation Note  Allen Cervantes , a 83 y.o. male  was evaluated in triage.  Pt complains of urinary retention, feels like he can't pee with suprapubic pain.  Review of Systems  Positive: Suprapubic pain Negative: Fever  Physical Exam  BP (!) 127/91 (BP Location: Left Arm)   Pulse 66   Temp 98.3 F (36.8 C) (Oral)   Resp 16   Ht 6\' 2"  (1.88 m)   Wt 113.4 kg   SpO2 96%   BMI 32.10 kg/m  Gen:   Awake, no distress   Resp:  Normal effort  MSK:   Moves extremities without difficulty  Other:    Medical Decision Making  Medically screening exam initiated at 6:54 PM.  Appropriate orders placed.  Allen Cervantes was informed that the remainder of the evaluation will be completed by another provider, this initial triage assessment does not replace that evaluation, and the importance of remaining in the ED until their evaluation is complete.  Bladder scan is not working.    Eudora Heron, [place] 06/05/23 [id number]

## 2023-06-05 NOTE — ED Provider Notes (Signed)
 [place] EMERGENCY DEPARTMENT AT [facility] Provider Note   CSN: [id number] Arrival date & time: 06/05/23  1810     History  Chief Complaint  Patient presents with   Urinary Retention    Allen Cervantes is a 83 y.o. male.  83 year old male presents with urinary retention. History of prostate cancer, seen by Dr. Ranae Burrow with urology for hematuria and had vessels cauterized. Foley was removed in office on Monday, patient was unable to void and a new foley was placed. Patient returned to the office today and a=the foley was removed, he was able to void in the office but has been unable to void since. No fevers, no vomiting.        Home Medications Prior to Admission medications   Medication Sig Start Date End Date Taking? Authorizing Provider  cephALEXin  (KEFLEX ) 500 MG capsule Take 1 capsule (500 mg total) by mouth 2 (two) times daily for 7 days. 06/05/23 06/12/23 Yes Darlis Eisenmenger, PA-C  aspirin EC 81 MG tablet Take 81 mg by mouth at bedtime. Swallow whole.    [provider]  brimonidine (ALPHAGAN) 0.2 % ophthalmic solution Place 1 drop into both eyes daily at 6 (six) AM. 06/30/20   [provider]  clobetasol ointment (TEMOVATE) 0.05 % Apply 1 Application topically See admin instructions. Apply to bilateral lower legs 2 times a day 05/01/23   [provider]  hydrochlorothiazide (HYDRODIURIL) 25 MG tablet Take 25 mg by mouth in the morning.    [provider]  Multiple Vitamin (MULTIVITAMINS PO) Take 1 tablet by mouth daily with breakfast.    [provider]  pantoprazole (PROTONIX) 40 MG tablet Take 40 mg by mouth daily before supper. 03/13/17   [provider]  rosuvastatin (CRESTOR) 10 MG tablet Take 10 mg by mouth at bedtime. 02/11/20   [provider]  tamsulosin (FLOMAX) 0.4 MG CAPS capsule Take 0.4 mg by mouth in the morning. 04/25/20   [provider]  timolol (TIMOPTIC) 0.5 % ophthalmic solution Place  1 drop into both eyes daily at 6 (six) AM. 06/30/20   [provider]      Allergies    Patient has no known allergies.    Review of Systems   Review of Systems Negative except as per HPI Physical Exam Updated Vital Signs BP (!) 127/91 (BP Location: Left Arm)   Pulse 66   Temp 98.3 F (36.8 C) (Oral)   Resp 16   Ht 6\' 2"  (1.88 m)   Wt 113.4 kg   SpO2 96%   BMI 32.10 kg/m  Physical Exam Vitals and nursing note reviewed.  Constitutional:      General: He is not in acute distress.    Appearance: He is well-developed. He is not diaphoretic.  HENT:     Head: Normocephalic and atraumatic.  Pulmonary:     Effort: Pulmonary effort is normal.  Abdominal:     Palpations: Abdomen is soft.     Comments: Bladder distended   Skin:    General: Skin is warm and dry.  Neurological:     Mental Status: He is alert and oriented to person, place, and time.  Psychiatric:        Behavior: Behavior normal.     ED Results / Procedures / Treatments   Labs (all labs ordered are listed, but only abnormal results are displayed) Labs Reviewed  URINALYSIS, ROUTINE W REFLEX MICROSCOPIC - Abnormal; Notable for the following  components:      Result Value   APPearance HAZY (*)    Hgb urine dipstick LARGE (*)    Protein, ur 100 (*)    Leukocytes,Ua MODERATE (*)    Bacteria, UA MANY (*)    All other components within normal limits  CBC WITH DIFFERENTIAL/PLATELET - Abnormal; Notable for the following components:   WBC 12.5 (*)    RBC 3.63 (*)    Hemoglobin 12.2 (*)    HCT 35.8 (*)    Neutro Abs 9.4 (*)    Monocytes Absolute 1.4 (*)    Abs Immature Granulocytes 0.33 (*)    All other components within normal limits  BASIC METABOLIC PANEL WITH GFR - Abnormal; Notable for the following components:   Glucose, Bld 115 (*)    BUN 26 (*)    Creatinine, Ser 1.37 (*)    GFR, Estimated 52 (*)    All other components within normal limits  URINE CULTURE    EKG None  Radiology No  results found.  Procedures Procedures    Medications Ordered in ED Medications  cephALEXin  (KEFLEX ) capsule 500 mg (has no administration in time range)    ED Course/ Medical Decision Making/ A&P                                 Medical Decision Making Amount and/or Complexity of Data Reviewed Labs: ordered.  Risk Prescription drug management.   This patient presents to the ED for concern of urinary retention, this involves an extensive number of treatment options, and is a complaint that carries with it a high risk of complications and morbidity.  The differential diagnosis includes but not limited to obstruction, AKI, renal failure    Co morbidities that complicate the patient evaluation  GERD, prostate cancer, diverticulosis, CAD   Additional history obtained:  External records from outside source obtained and reviewed including Prior labs on file for comparison   Lab Tests:  I Ordered, and personally interpreted labs.  The pertinent results include: CBC with mild excesses of white count of 12.5 with increase in neutrophils.  BMP with slightly increased creatinine 1.37.  Patient admits that he has not been drinking very much today.  His urinalysis suggest UTI with large hemoglobin, protein, moderate leukocytes with many bacteria.  He started on Keflex  and a culture has been sent out.   Problem List / ED Course / Critical interventions / Medication management  83 year old male with complaint of urinary retention as above.  Foley catheter was placed with return of cloudy urine and a small clot.  Patient is afebrile, does have slight white count with mildly increased creatinine and urine suggestive of infection.  He started on antibiotics with close follow-up and return precautions.  He is discharged home with Foley catheter in place. I have reviewed the patients home medicines and have made adjustments as needed   Social Determinants of Health:  Has urologist     Test / Admission - Considered:  Stable for discharge with return precautions provided         Final Clinical Impression(s) / ED Diagnoses Final diagnoses:  Acute urinary retention  Leukocytosis, unspecified type  Elevated serum creatinine  Acute cystitis with hematuria    Rx / DC Orders ED Discharge Orders          Ordered    cephALEXin  (KEFLEX ) 500 MG capsule  2 times daily  06/05/23 2127              Darlis Eisenmenger, PA-C 06/05/23 2132    Sallyanne Creamer, DO 06/11/23 [phone]

## 2023-06-05 NOTE — ED Notes (Signed)
 Bladder scan doesn't seem to be working properly, pt states that he has not voided since this am when the did the voiding trial.  Pt requests foley, PA at bedside.

## 2023-06-05 NOTE — ED Triage Notes (Signed)
 Pt to er, pt states that he has been here several times to have catheters placed, states that last week he had some bleeding and they cauterized.  States that the foley was removed Monday and had to put it back in, states that he is here because they took the catheter out this am and he needs it replaced.

## 2023-06-05 NOTE — Discharge Instructions (Addendum)
 Follow up with your urologist, call tomorrow to schedule an appointment. Return to the ER for fever, vomiting, concerning symptoms.   Take Keflex as prescribed and complete the full course.  A urine culture has been sent out, your urologist can follow this up in the coming days.  Your Creatinine is slightly elevated today, be sure to drink plenty of fluids. Your primary care provider can recheck this lab next week.

## 2023-06-07 LAB — URINE CULTURE: Culture: >=100000 — AB

## 2023-06-08 ENCOUNTER — Telehealth (HOSPITAL_BASED_OUTPATIENT_CLINIC_OR_DEPARTMENT_OTHER): Payer: Self-pay | Admitting: *Deleted

## 2023-06-08 ENCOUNTER — Telehealth (HOSPITAL_COMMUNITY): Payer: Self-pay | Admitting: Emergency Medicine

## 2023-06-08 MED ORDER — CIPROFLOXACIN HCL 500 MG PO TABS: 500.0000 mg | ORAL_TABLET | Freq: Two times a day (BID) | ORAL | 0 refills | Status: AC

## 2023-06-08 NOTE — Progress Notes (Signed)
 ED Antimicrobial Stewardship Positive Culture Follow Up   Allen Cervantes is an 83 y.o. male who presented to [facility] on 06/05/2023 with a chief complaint of  Chief Complaint  Patient presents with   Urinary Retention    Recent Results (from the past 720 hours)  Urine Culture     Status: Abnormal   Collection Time: 06/05/23  8:16 PM   Specimen: Urine, Catheterized  Result Value Ref Range Status   Specimen Description   Final    URINE, CATHETERIZED Performed at [facility], [address], [place], [place] [address]    Special Requests   Final    NONE Performed at [facility], [address], [place], [place] [address]    Culture >=100,000 COLONIES/mL ENTEROBACTER CLOACAE (A)  Final   Report Status 06/07/2023 FINAL  Final   Organism ID, Bacteria ENTEROBACTER CLOACAE (A)  Final      Susceptibility   Enterobacter cloacae - MIC*    CEFEPIME <=0.12 SENSITIVE Sensitive     CIPROFLOXACIN  <=0.25 SENSITIVE Sensitive     GENTAMICIN <=1 SENSITIVE Sensitive     IMIPENEM <=0.25 SENSITIVE Sensitive     NITROFURANTOIN 64 INTERMEDIATE Intermediate     TRIMETH/SULFA <=20 SENSITIVE Sensitive     PIP/TAZO <=4 SENSITIVE Sensitive ug/mL    * >=100,000 COLONIES/mL ENTEROBACTER CLOACAE    [x]  Treated with Keflex , organism resistant to prescribed antimicrobial   New antibiotic prescription: ciprofloxacin  500 mg BID x 7 days  ED Provider: Paul Boring, PA   Lolita Rise, PharmD, BCPS Clinical Pharmacist 06/08/2023 8:32 AM

## 2023-06-08 NOTE — Telephone Encounter (Signed)
 Post ED Visit - Positive Culture Follow-up: Successful Patient Follow-Up  Culture assessed and recommendations reviewed by:  []  Court Distance, Pharm.D. []  Skeet Duke, [address].D., BCPS AQ-ID []  Leslee Rase, Pharm.D., BCPS []  Garland Junk, Pharm.D., BCPS []  [place], [place].D., BCPS, AAHIVP []  Alcide Aly, Pharm.D., BCPS, AAHIVP []  Jerri Morale, PharmD, BCPS []  Graham Laws, PharmD, BCPS []  Cleda Curly, PharmD, BCPS [x]  Adella Agee, PharmD  Positive urine culture  []  Patient discharged without antimicrobial prescription and treatment is now indicated []  Organism is resistant to prescribed ED discharge antimicrobial []  Patient with positive blood cultures  Changes discussed with ED provider: Paul Boring New antibiotic prescription Cipro  500mg  BID x 7 days  Paul Boring, PA called in Rx for pt and spoke to pt per previous note.    Georgine Kitchens 06/08/2023, 8:38 AM

## 2023-06-08 NOTE — Telephone Encounter (Cosign Needed)
 Called in Cipro  per pharmacy recommendations.  Called patient to let him know of change.  He is in agreement with plan.

## 2023-06-09 DIAGNOSIS — N401 Enlarged prostate with lower urinary tract symptoms: Secondary | ICD-10-CM | POA: Diagnosis not present

## 2023-06-09 DIAGNOSIS — N3041 Irradiation cystitis with hematuria: Secondary | ICD-10-CM | POA: Diagnosis not present

## 2023-06-09 DIAGNOSIS — R338 Other retention of urine: Secondary | ICD-10-CM | POA: Diagnosis not present

## 2023-06-18 DIAGNOSIS — C61 Malignant neoplasm of prostate: Secondary | ICD-10-CM | POA: Diagnosis not present

## 2023-06-20 DIAGNOSIS — R338 Other retention of urine: Secondary | ICD-10-CM | POA: Diagnosis not present

## 2023-06-25 ENCOUNTER — Other Ambulatory Visit: Payer: Self-pay

## 2023-06-25 ENCOUNTER — Emergency Department (HOSPITAL_COMMUNITY)
Admission: EM | Admit: 2023-06-25 | Discharge: 2023-06-25 | Disposition: A | Discharge status: Home / Self Care | Attending: Emergency Medicine | Admitting: Emergency Medicine

## 2023-06-25 DIAGNOSIS — Z8546 Personal history of malignant neoplasm of prostate: Secondary | ICD-10-CM | POA: Insufficient documentation

## 2023-06-25 DIAGNOSIS — R339 Retention of urine, unspecified: Secondary | ICD-10-CM | POA: Insufficient documentation

## 2023-06-25 DIAGNOSIS — Z7982 Long term (current) use of aspirin: Secondary | ICD-10-CM | POA: Insufficient documentation

## 2023-06-25 DIAGNOSIS — N401 Enlarged prostate with lower urinary tract symptoms: Secondary | ICD-10-CM | POA: Diagnosis not present

## 2023-06-25 DIAGNOSIS — R338 Other retention of urine: Secondary | ICD-10-CM | POA: Diagnosis not present

## 2023-06-25 DIAGNOSIS — N3041 Irradiation cystitis with hematuria: Secondary | ICD-10-CM | POA: Diagnosis not present

## 2023-06-25 DIAGNOSIS — E291 Testicular hypofunction: Secondary | ICD-10-CM | POA: Diagnosis not present

## 2023-06-25 NOTE — Discharge Instructions (Signed)
 Thank you for letting evaluate you today.  We have placed a Foley catheter for you to take at home.  Please make sure to follow-up with urology tomorrow for further management.  Return to emergency department if you continue have urinary retention, abdominal distention, abdominal pain

## 2023-06-25 NOTE — ED Notes (Signed)
 Pt urine bag changed to a leg bag

## 2023-06-25 NOTE — ED Provider Notes (Signed)
 [place] EMERGENCY DEPARTMENT AT [facility] Provider Note   CSN: [id number] Arrival date & time: 06/25/23  1956     History {Add pertinent medical, surgical, social history, OB history to HPI:1} Chief Complaint  Patient presents with   Urinary Retention    Allen Cervantes is a 83 y.o. male with past medical history of prostate cancer (3 years ago and currently in remission), GERD, diverticulosis, Barrett's esophagus presents emergency department for evaluation of urinary retention.  He reports that he has been dealing with multiple episodes of retention requiring urinary catheter over the past several weeks.  He is followed by Dr. Ranae Burrow at [facility] urology.    Today, he was evaluated by Dr. Mabel Savage who removed his urinary catheter today. He was provided three doses of antibiotics by Dr. Ranae Burrow although he is unable to recall the name of medicine. Had labs draw by urology last week. He had 2 episodes of urination today with last urination at 1400. Has not peed for 8 hours following. Has complaints of suprapubic abdominal pain and distension since.  HPI     Home Medications Prior to Admission medications   Medication Sig Start Date End Date Taking? Authorizing Provider  aspirin EC 81 MG tablet Take 81 mg by mouth at bedtime. Swallow whole.    [provider]  brimonidine (ALPHAGAN) 0.2 % ophthalmic solution Place 1 drop into both eyes daily at 6 (six) AM. 06/30/20   [provider]  ciprofloxacin  (CIPRO ) 500 MG tablet Take 1 tablet (500 mg total) by mouth 2 (two) times daily. 06/08/23   Felicie Horning, PA-C  clobetasol ointment (TEMOVATE) 0.05 % Apply 1 Application topically See admin instructions. Apply to bilateral lower legs 2 times a day 05/01/23   [provider]  hydrochlorothiazide (HYDRODIURIL) 25 MG tablet Take 25 mg by mouth in the morning.    [provider]  Multiple Vitamin (MULTIVITAMINS PO) Take 1 tablet by mouth daily with  breakfast.    [provider]  pantoprazole (PROTONIX) 40 MG tablet Take 40 mg by mouth daily before supper. 03/13/17   [provider]  rosuvastatin (CRESTOR) 10 MG tablet Take 10 mg by mouth at bedtime. 02/11/20   [provider]  tamsulosin (FLOMAX) 0.4 MG CAPS capsule Take 0.4 mg by mouth in the morning. 04/25/20   [provider]  timolol (TIMOPTIC) 0.5 % ophthalmic solution Place 1 drop into both eyes daily at 6 (six) AM. 06/30/20   [provider]      Allergies    Patient has no known allergies.    Review of Systems   Review of Systems  Constitutional:  Negative for chills, fatigue and fever.  Respiratory:  Negative for cough, chest tightness, shortness of breath and wheezing.   Cardiovascular:  Negative for chest pain and palpitations.  Gastrointestinal:  Negative for abdominal pain, constipation, diarrhea, nausea and vomiting.  Genitourinary:  Positive for difficulty urinating.  Neurological:  Negative for dizziness, seizures, weakness, light-headedness, numbness and headaches.    Physical Exam Updated Vital Signs BP (!) 147/79   Pulse 79   Temp 97.9 F (36.6 C)   Resp 18   Ht 6\' 2"  (1.88 m)   Wt 113.4 kg   SpO2 94%   BMI 32.10 kg/m  Physical Exam Vitals and nursing note reviewed.  Constitutional:      General: He is not in acute distress.    Appearance: Normal appearance.  HENT:     Head:  Normocephalic and atraumatic.  Eyes:     Conjunctiva/sclera: Conjunctivae normal.  Cardiovascular:     Rate and Rhythm: Normal rate.  Pulmonary:     Effort: Pulmonary effort is normal. No respiratory distress.     Breath sounds: Normal breath sounds.  Chest:     Chest wall: No tenderness.  Abdominal:     General: Bowel sounds are normal.     Palpations: Abdomen is soft.     Comments: Bladder distension with mild TTP  Skin:    Capillary Refill: Capillary refill takes less than 2 seconds.     Coloration: Skin is not jaundiced or  pale.  Neurological:     Mental Status: He is alert and oriented to person, place, and time. Mental status is at baseline.     ED Results / Procedures / Treatments   Labs (all labs ordered are listed, but only abnormal results are displayed) Labs Reviewed - No data to display  EKG None  Radiology No results found.  Procedures Procedures  {Document cardiac monitor, telemetry assessment procedure when appropriate:1}  Medications Ordered in ED Medications - No data to display  ED Course/ Medical Decision Making/ A&P   {   Click here for ABCD2, HEART and other calculatorsREFRESH Note before signing :1}                              Medical Decision Making    Patient presents to the ED for concern of ***, this involves an extensive number of treatment options, and is a complaint that carries with it a high risk of complications and morbidity.  The differential diagnosis includes ***   Co morbidities that complicate the patient evaluation  ***   Additional history obtained:  Additional history obtained from *** {Blank multiple:19196::"EMS","Family","Nursing","Outside Medical Records","Past Admission"}   External records from outside source obtained and reviewed including ***   Lab Tests:  I Ordered, and personally interpreted labs.  The pertinent results include:  ***   Imaging Studies ordered:  I ordered imaging studies including ***  I independently visualized and interpreted imaging which showed *** I agree with the radiologist interpretation   Cardiac Monitoring:  The patient was maintained on a cardiac monitor.  I personally viewed and interpreted the cardiac monitored which showed an underlying rhythm of: ***   Medicines ordered and prescription drug management:  I ordered medication including ***  for ***  Reevaluation of the patient after these medicines showed that the patient {resolved/improved/worsened:23923::"improved"} I have reviewed the  patients home medicines and have made adjustments as needed   Test Considered:  ***   Critical Interventions:  ***   Consultations Obtained:  I requested consultation with the ***,  and discussed lab and imaging findings as well as pertinent plan - they recommend: ***   Problem List / ED Course:  Urinary retention Bladder scan showed 300 cc    Reevaluation:  After the interventions noted above, I reevaluated the patient and found that they have :{resolved/improved/worsened:23923::"improved"}   Social Determinants of Health:  ***   Dispostion:  After consideration of the diagnostic results and the patients response to treatment, I feel that the patent would benefit from ***.    {Document critical care time when appropriate:1} {Document review of labs and clinical decision tools ie heart score, Chads2Vasc2 etc:1}  {Document your independent review of radiology images, and any outside records:1} {Document your discussion with family members, caretakers, and  with consultants:1} {Document social determinants of health affecting pt's care:1} {Document your decision making why or why not admission, treatments were needed:1} Final Clinical Impression(s) / ED Diagnoses Final diagnoses:  None    Rx / DC Orders ED Discharge Orders     None

## 2023-06-25 NOTE — ED Triage Notes (Signed)
 Pt reports he has had a chronic catheter for 2 months and had it removed today after having a procedure recently to fix bladder issue per pt. Pt was able to urinate this morning but hasn't been able to urinate since 3pm today.

## 2023-07-01 DIAGNOSIS — R338 Other retention of urine: Secondary | ICD-10-CM | POA: Diagnosis not present

## 2023-07-01 DIAGNOSIS — N3041 Irradiation cystitis with hematuria: Secondary | ICD-10-CM | POA: Diagnosis not present

## 2023-07-01 DIAGNOSIS — N401 Enlarged prostate with lower urinary tract symptoms: Secondary | ICD-10-CM | POA: Diagnosis not present

## 2023-07-08 DIAGNOSIS — R338 Other retention of urine: Secondary | ICD-10-CM | POA: Diagnosis not present

## 2023-07-10 ENCOUNTER — Other Ambulatory Visit: Payer: Self-pay

## 2023-07-10 ENCOUNTER — Encounter (HOSPITAL_COMMUNITY): Payer: Self-pay

## 2023-07-10 ENCOUNTER — Emergency Department (HOSPITAL_COMMUNITY)
Admission: EM | Admit: 2023-07-10 | Discharge: 2023-07-10 | Disposition: A | Discharge status: Home / Self Care | Attending: Emergency Medicine | Admitting: Emergency Medicine

## 2023-07-10 DIAGNOSIS — I2510 Atherosclerotic heart disease of native coronary artery without angina pectoris: Secondary | ICD-10-CM | POA: Diagnosis not present

## 2023-07-10 DIAGNOSIS — Z7982 Long term (current) use of aspirin: Secondary | ICD-10-CM | POA: Insufficient documentation

## 2023-07-10 DIAGNOSIS — R338 Other retention of urine: Secondary | ICD-10-CM | POA: Diagnosis not present

## 2023-07-10 DIAGNOSIS — R339 Retention of urine, unspecified: Secondary | ICD-10-CM | POA: Diagnosis not present

## 2023-07-10 DIAGNOSIS — Z8546 Personal history of malignant neoplasm of prostate: Secondary | ICD-10-CM | POA: Diagnosis not present

## 2023-07-10 LAB — URINALYSIS, ROUTINE W REFLEX MICROSCOPIC
Bilirubin Urine: NEGATIVE
Glucose, UA: NEGATIVE mg/dL
Ketones, ur: NEGATIVE mg/dL
Nitrite: NEGATIVE
Protein, ur: NEGATIVE mg/dL
Specific Gravity, Urine: 1.006 (ref 1.005–1.030)
pH: 6 (ref 5.0–8.0)

## 2023-07-10 NOTE — ED Triage Notes (Signed)
 Pt reports ongoing issues with urinary retention and had catheter removed two days ago and hasn't been able to urinate since 8pm yesterday.

## 2023-07-10 NOTE — ED Provider Notes (Signed)
 [place] EMERGENCY DEPARTMENT AT [facility] Provider Note   CSN: [id number] Arrival date & time: 07/10/23  0114     History  Chief Complaint  Patient presents with   Urinary Retention    Allen Cervantes is a 83 y.o. male.  The history is provided by the patient.  He has history of prostate cancer, coronary artery disease, GERD and comes in because of urinary retention.  He has been having problems with urinary retention over the last 2 months and has had catheters placed and removed several times.  Most recent catheter was removed on 07/08/2023.  He had been urinating fine through the day, last urination was at 8 PM.  Since then, he has not been able to urinate.  He has not had any hematuria that he had noticed.   Home Medications Prior to Admission medications   Medication Sig Start Date End Date Taking? Authorizing Provider  aspirin EC 81 MG tablet Take 81 mg by mouth at bedtime. Swallow whole.    [provider]  brimonidine (ALPHAGAN) 0.2 % ophthalmic solution Place 1 drop into both eyes daily at 6 (six) AM. 06/30/20   [provider]  ciprofloxacin  (CIPRO ) 500 MG tablet Take 1 tablet (500 mg total) by mouth 2 (two) times daily. 06/08/23   Felicie Horning, PA-C  clobetasol ointment (TEMOVATE) 0.05 % Apply 1 Application topically See admin instructions. Apply to bilateral lower legs 2 times a day 05/01/23   [provider]  hydrochlorothiazide (HYDRODIURIL) 25 MG tablet Take 25 mg by mouth in the morning.    [provider]  Multiple Vitamin (MULTIVITAMINS PO) Take 1 tablet by mouth daily with breakfast.    [provider]  pantoprazole (PROTONIX) 40 MG tablet Take 40 mg by mouth daily before supper. 03/13/17   [provider]  rosuvastatin (CRESTOR) 10 MG tablet Take 10 mg by mouth at bedtime. 02/11/20   [provider]  tamsulosin (FLOMAX) 0.4 MG CAPS capsule Take 0.4 mg by mouth in the morning. 04/25/20    [provider]  timolol (TIMOPTIC) 0.5 % ophthalmic solution Place 1 drop into both eyes daily at 6 (six) AM. 06/30/20   [provider]      Allergies    Patient has no known allergies.    Review of Systems   Review of Systems  All other systems reviewed and are negative.   Physical Exam Updated Vital Signs BP 132/78 (BP Location: Right Arm)   Pulse 73   Temp 98.6 F (37 C)   Resp 18   SpO2 94%  Physical Exam Vitals and nursing note reviewed.   83 year old male, resting comfortably and in no acute distress. Vital signs are normal. Oxygen saturation is 94%, which is normal. Head is normocephalic and atraumatic. PERRLA, EOMI.  Back is nontender and there is no CVA tenderness. Lungs are clear without rales, wheezes, or rhonchi. Chest is nontender. Heart has regular rate and rhythm without murmur. Abdomen is soft, flat, nontender.  Bladder is distended almost to the umbilicus. Extremities have 2+ edema,. Neurologic: Awake and alert, moves all extremities equally.  ED Results / Procedures / Treatments   Labs (all labs ordered are listed, but only abnormal results are displayed) Labs Reviewed  URINALYSIS, ROUTINE W REFLEX MICROSCOPIC - Abnormal; Notable for the following components:      Result Value   Hgb urine dipstick MODERATE (*)    Leukocytes,Ua SMALL (*)    Bacteria,  UA FEW (*)    All other components within normal limits   Procedures Procedures    Medications Ordered in ED Medications - No data to display  ED Course/ Medical Decision Making/ A&P                                 Medical Decision Making Amount and/or Complexity of Data Reviewed Labs: ordered.   Recurrent urinary retention.  Bladder scan confirmed urinary retention with 600 mL in the bladder.  I ordered a Foley catheter be placed.  I reviewed his past records, and note ED visits on multiple occasions since 05/13/2023 for urinary retention, most recently on 06/25/2023.  I plan  on discharging him with follow-up with his urologist.  I have reviewed his laboratory tests, my interpretation is normal urinalysis.  I am discharging him with instructions to follow-up with his urologist.  Final Clinical Impression(s) / ED Diagnoses Final diagnoses:  Acute urinary retention    Rx / DC Orders ED Discharge Orders     None         Alissa April, MD 07/10/23 [phone]

## 2023-07-17 DIAGNOSIS — R338 Other retention of urine: Secondary | ICD-10-CM | POA: Diagnosis not present

## 2023-07-17 DIAGNOSIS — N401 Enlarged prostate with lower urinary tract symptoms: Secondary | ICD-10-CM | POA: Diagnosis not present

## 2023-07-31 DIAGNOSIS — R338 Other retention of urine: Secondary | ICD-10-CM | POA: Diagnosis not present

## 2023-07-31 DIAGNOSIS — N3041 Irradiation cystitis with hematuria: Secondary | ICD-10-CM | POA: Diagnosis not present

## 2023-09-10 DIAGNOSIS — N3041 Irradiation cystitis with hematuria: Secondary | ICD-10-CM | POA: Diagnosis not present

## 2023-09-10 DIAGNOSIS — Z8546 Personal history of malignant neoplasm of prostate: Secondary | ICD-10-CM | POA: Diagnosis not present

## 2023-09-10 DIAGNOSIS — E230 Hypopituitarism: Secondary | ICD-10-CM | POA: Diagnosis not present

## 2023-09-10 DIAGNOSIS — R338 Other retention of urine: Secondary | ICD-10-CM | POA: Diagnosis not present

## 2023-09-10 DIAGNOSIS — R600 Localized edema: Secondary | ICD-10-CM | POA: Diagnosis not present

## 2023-09-10 DIAGNOSIS — N401 Enlarged prostate with lower urinary tract symptoms: Secondary | ICD-10-CM | POA: Diagnosis not present

## 2023-09-11 ENCOUNTER — Other Ambulatory Visit (HOSPITAL_COMMUNITY): Payer: Self-pay | Admitting: Internal Medicine

## 2023-09-11 DIAGNOSIS — R600 Localized edema: Secondary | ICD-10-CM

## 2023-09-15 ENCOUNTER — Ambulatory Visit (HOSPITAL_COMMUNITY)
Admission: RE | Admit: 2023-09-15 | Discharge: 2023-09-15 | Disposition: A | Discharge status: Home / Self Care | Source: Ambulatory Visit | Attending: Surgery | Admitting: Surgery

## 2023-09-15 DIAGNOSIS — R600 Localized edema: Secondary | ICD-10-CM | POA: Insufficient documentation

## 2023-09-15 LAB — ECHOCARDIOGRAM COMPLETE
AR max vel: 2.41 cm2
AV Area VTI: 2.49 cm2
AV Area mean vel: 2.39 cm2
AV Mean grad: 5 mmHg
AV Peak grad: 9.2 mmHg
Ao pk vel: 1.52 m/s
Area-P 1/2: 2.62 cm2
S' Lateral: 3.26 cm

## 2023-09-19 DIAGNOSIS — I872 Venous insufficiency (chronic) (peripheral): Secondary | ICD-10-CM | POA: Diagnosis not present

## 2023-10-03 DIAGNOSIS — Z23 Encounter for immunization: Secondary | ICD-10-CM | POA: Diagnosis not present

## 2023-12-05 DIAGNOSIS — E230 Hypopituitarism: Secondary | ICD-10-CM | POA: Diagnosis not present

## 2023-12-05 DIAGNOSIS — L400 Psoriasis vulgaris: Secondary | ICD-10-CM | POA: Diagnosis not present

## 2023-12-05 DIAGNOSIS — Z8546 Personal history of malignant neoplasm of prostate: Secondary | ICD-10-CM | POA: Diagnosis not present

## 2023-12-12 DIAGNOSIS — E291 Testicular hypofunction: Secondary | ICD-10-CM | POA: Diagnosis not present

## 2023-12-12 DIAGNOSIS — C61 Malignant neoplasm of prostate: Secondary | ICD-10-CM | POA: Diagnosis not present

## 2023-12-17 DIAGNOSIS — K2270 Barrett's esophagus without dysplasia: Secondary | ICD-10-CM | POA: Diagnosis not present

## 2023-12-17 DIAGNOSIS — R7303 Prediabetes: Secondary | ICD-10-CM | POA: Diagnosis not present

## 2023-12-17 DIAGNOSIS — Z8546 Personal history of malignant neoplasm of prostate: Secondary | ICD-10-CM | POA: Diagnosis not present

## 2023-12-17 DIAGNOSIS — Z0000 Encounter for general adult medical examination without abnormal findings: Secondary | ICD-10-CM | POA: Diagnosis not present

## 2023-12-17 DIAGNOSIS — I700 Atherosclerosis of aorta: Secondary | ICD-10-CM | POA: Diagnosis not present

## 2023-12-17 DIAGNOSIS — I2510 Atherosclerotic heart disease of native coronary artery without angina pectoris: Secondary | ICD-10-CM | POA: Diagnosis not present

## 2023-12-17 DIAGNOSIS — Z23 Encounter for immunization: Secondary | ICD-10-CM | POA: Diagnosis not present

## 2023-12-17 DIAGNOSIS — L409 Psoriasis, unspecified: Secondary | ICD-10-CM | POA: Diagnosis not present

## 2023-12-17 DIAGNOSIS — E7800 Pure hypercholesterolemia, unspecified: Secondary | ICD-10-CM | POA: Diagnosis not present

## 2023-12-17 DIAGNOSIS — I872 Venous insufficiency (chronic) (peripheral): Secondary | ICD-10-CM | POA: Diagnosis not present

## 2023-12-17 DIAGNOSIS — Z1389 Encounter for screening for other disorder: Secondary | ICD-10-CM | POA: Diagnosis not present

## 2023-12-17 DIAGNOSIS — R600 Localized edema: Secondary | ICD-10-CM | POA: Diagnosis not present

## 2023-12-22 DIAGNOSIS — L400 Psoriasis vulgaris: Secondary | ICD-10-CM | POA: Diagnosis not present

## 2023-12-22 DIAGNOSIS — Z111 Encounter for screening for respiratory tuberculosis: Secondary | ICD-10-CM | POA: Diagnosis not present

## 2023-12-22 DIAGNOSIS — R7303 Prediabetes: Secondary | ICD-10-CM | POA: Diagnosis not present

## 2023-12-22 DIAGNOSIS — E7800 Pure hypercholesterolemia, unspecified: Secondary | ICD-10-CM | POA: Diagnosis not present

## 2023-12-22 DIAGNOSIS — Z8546 Personal history of malignant neoplasm of prostate: Secondary | ICD-10-CM | POA: Diagnosis not present

## 2024-01-07 DIAGNOSIS — E1169 Type 2 diabetes mellitus with other specified complication: Secondary | ICD-10-CM | POA: Diagnosis not present

## 2024-02-11 ENCOUNTER — Other Ambulatory Visit: Payer: Self-pay

## 2024-02-11 DIAGNOSIS — I872 Venous insufficiency (chronic) (peripheral): Secondary | ICD-10-CM

## 2024-03-02 ENCOUNTER — Ambulatory Visit (INDEPENDENT_AMBULATORY_CARE_PROVIDER_SITE_OTHER): Admitting: Vascular Surgery

## 2024-03-02 ENCOUNTER — Encounter: Payer: Self-pay | Admitting: Vascular Surgery

## 2024-03-02 ENCOUNTER — Ambulatory Visit (HOSPITAL_COMMUNITY)
Admission: RE | Admit: 2024-03-02 | Discharge: 2024-03-02 | Disposition: A | Source: Ambulatory Visit | Attending: Surgery | Admitting: Surgery

## 2024-03-02 VITALS — BP 132/71 | HR 62 | Temp 98.1°F | Resp 20 | Ht 74.0 in | Wt 254.2 lb

## 2024-03-02 DIAGNOSIS — I872 Venous insufficiency (chronic) (peripheral): Secondary | ICD-10-CM | POA: Insufficient documentation
